# Patient Record
Sex: Female | Born: 1965 | Race: White | Hispanic: No | Marital: Single | Smoking: Current every day smoker
Health system: Southern US, Community
[De-identification: ages and names within clinical notes are randomized; demographics above are authoritative.]

## PROBLEM LIST (undated history)

## (undated) DIAGNOSIS — T8859XA Other complications of anesthesia, initial encounter: Secondary | ICD-10-CM

## (undated) DIAGNOSIS — Z789 Other specified health status: Secondary | ICD-10-CM

## (undated) HISTORY — PX: KNEE ARTHROSCOPY: SUR90

## (undated) HISTORY — PX: KNEE SURGERY: SHX244

## (undated) HISTORY — PX: DENTAL SURGERY: SHX609

---

## 1998-04-21 ENCOUNTER — Emergency Department (HOSPITAL_COMMUNITY): Admission: EM | Admit: 1998-04-21 | Discharge: 1998-04-21 | Payer: Self-pay

## 1998-04-21 ENCOUNTER — Encounter: Payer: Self-pay | Admitting: Endocrinology

## 1999-11-11 ENCOUNTER — Other Ambulatory Visit: Admission: RE | Admit: 1999-11-11 | Discharge: 1999-11-11 | Payer: Self-pay | Admitting: Obstetrics and Gynecology

## 1999-11-14 ENCOUNTER — Encounter: Payer: Self-pay | Admitting: Obstetrics and Gynecology

## 1999-11-14 ENCOUNTER — Inpatient Hospital Stay (HOSPITAL_COMMUNITY): Admission: RE | Admit: 1999-11-14 | Discharge: 1999-11-14 | Payer: Self-pay | Admitting: *Deleted

## 2000-01-08 ENCOUNTER — Ambulatory Visit (HOSPITAL_COMMUNITY): Admission: RE | Admit: 2000-01-08 | Discharge: 2000-01-08 | Payer: Self-pay | Admitting: Obstetrics and Gynecology

## 2000-01-16 ENCOUNTER — Ambulatory Visit (HOSPITAL_COMMUNITY): Admission: RE | Admit: 2000-01-16 | Discharge: 2000-01-16 | Payer: Self-pay | Admitting: Obstetrics and Gynecology

## 2000-03-27 ENCOUNTER — Inpatient Hospital Stay (HOSPITAL_COMMUNITY): Admission: AD | Admit: 2000-03-27 | Discharge: 2000-03-30 | Payer: Self-pay | Admitting: Obstetrics and Gynecology

## 2002-06-20 ENCOUNTER — Inpatient Hospital Stay (HOSPITAL_COMMUNITY): Admission: EM | Admit: 2002-06-20 | Discharge: 2002-06-21 | Payer: Self-pay | Admitting: Emergency Medicine

## 2002-06-27 ENCOUNTER — Encounter: Admission: RE | Admit: 2002-06-27 | Discharge: 2002-06-27 | Payer: Self-pay | Admitting: Family Medicine

## 2002-10-06 ENCOUNTER — Emergency Department (HOSPITAL_COMMUNITY): Admission: EM | Admit: 2002-10-06 | Discharge: 2002-10-06 | Payer: Self-pay | Admitting: Emergency Medicine

## 2002-10-06 ENCOUNTER — Encounter: Payer: Self-pay | Admitting: Emergency Medicine

## 2004-04-21 ENCOUNTER — Encounter: Admission: RE | Admit: 2004-04-21 | Discharge: 2004-04-21 | Payer: Self-pay | Admitting: Family Medicine

## 2004-05-20 ENCOUNTER — Encounter: Admission: RE | Admit: 2004-05-20 | Discharge: 2004-06-02 | Payer: Self-pay | Admitting: Orthopedic Surgery

## 2006-07-19 ENCOUNTER — Emergency Department (HOSPITAL_COMMUNITY): Admission: EM | Admit: 2006-07-19 | Discharge: 2006-07-20 | Payer: Self-pay | Admitting: Emergency Medicine

## 2011-04-17 ENCOUNTER — Emergency Department (HOSPITAL_COMMUNITY): Payer: No Typology Code available for payment source

## 2011-04-17 ENCOUNTER — Emergency Department (HOSPITAL_COMMUNITY)
Admission: EM | Admit: 2011-04-17 | Discharge: 2011-04-18 | Disposition: A | Discharge status: Home / Self Care | Payer: No Typology Code available for payment source | Attending: Emergency Medicine | Admitting: Emergency Medicine

## 2011-04-17 ENCOUNTER — Encounter: Payer: Self-pay | Admitting: Emergency Medicine

## 2011-04-17 DIAGNOSIS — M5380 Other specified dorsopathies, site unspecified: Secondary | ICD-10-CM | POA: Insufficient documentation

## 2011-04-17 DIAGNOSIS — T1490XA Injury, unspecified, initial encounter: Secondary | ICD-10-CM | POA: Insufficient documentation

## 2011-04-17 DIAGNOSIS — M542 Cervicalgia: Secondary | ICD-10-CM | POA: Insufficient documentation

## 2011-04-17 DIAGNOSIS — M549 Dorsalgia, unspecified: Secondary | ICD-10-CM | POA: Insufficient documentation

## 2011-04-17 MED ORDER — CYCLOBENZAPRINE HCL 10 MG PO TABS
10.0000 mg | ORAL_TABLET | Freq: Once | ORAL | Status: AC
  Administered 2011-04-17: 10 mg via ORAL
  Filled 2011-04-17: qty 1

## 2011-04-17 MED ORDER — MORPHINE SULFATE 4 MG/ML IJ SOLN: 4.0000 mg | Freq: Once | INTRAMUSCULAR | Status: DC

## 2011-04-17 MED ORDER — MORPHINE SULFATE 2 MG/ML IJ SOLN
2.0000 mg | Freq: Once | INTRAMUSCULAR | Status: AC
  Administered 2011-04-17: 2 mg via INTRAMUSCULAR
  Filled 2011-04-17: qty 1

## 2011-04-17 MED ORDER — MORPHINE SULFATE 2 MG/ML IJ SOLN
2.0000 mg | Freq: Once | INTRAMUSCULAR | Status: DC
  Filled 2011-04-17: qty 1

## 2011-04-17 MED ORDER — HYDROCODONE-ACETAMINOPHEN 5-325 MG PO TABS: 2.0000 | ORAL_TABLET | Freq: Four times a day (QID) | ORAL | Status: AC | PRN

## 2011-04-17 MED ORDER — ONDANSETRON HCL 4 MG/2ML IJ SOLN
4.0000 mg | Freq: Once | INTRAMUSCULAR | Status: AC
  Administered 2011-04-17: 4 mg via INTRAMUSCULAR
  Filled 2011-04-17: qty 2

## 2011-04-17 MED ORDER — MORPHINE SULFATE 2 MG/ML IJ SOLN
2.0000 mg | Freq: Once | INTRAMUSCULAR | Status: AC
  Administered 2011-04-17: 2 mg via INTRAMUSCULAR

## 2011-04-17 MED ORDER — CYCLOBENZAPRINE HCL 10 MG PO TABS: 10.0000 mg | ORAL_TABLET | Freq: Three times a day (TID) | ORAL | Status: AC | PRN
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
## 2011-04-17 NOTE — ED Notes (Signed)
Returned from xray

## 2011-04-17 NOTE — ED Notes (Signed)
Patient transported to X-ray 

## 2011-05-25 ENCOUNTER — Ambulatory Visit: Payer: No Typology Code available for payment source

## 2011-06-01 ENCOUNTER — Ambulatory Visit: Payer: No Typology Code available for payment source | Attending: Orthopedic Surgery

## 2011-06-09 ENCOUNTER — Other Ambulatory Visit: Payer: Self-pay | Admitting: Orthopedic Surgery

## 2011-06-09 DIAGNOSIS — M545 Low back pain: Secondary | ICD-10-CM

## 2011-06-24 ENCOUNTER — Ambulatory Visit: Payer: No Typology Code available for payment source | Attending: Orthopedic Surgery

## 2011-06-24 DIAGNOSIS — M2560 Stiffness of unspecified joint, not elsewhere classified: Secondary | ICD-10-CM | POA: Insufficient documentation

## 2011-06-24 DIAGNOSIS — IMO0001 Reserved for inherently not codable concepts without codable children: Secondary | ICD-10-CM | POA: Insufficient documentation

## 2011-06-24 DIAGNOSIS — M2550 Pain in unspecified joint: Secondary | ICD-10-CM | POA: Insufficient documentation

## 2011-06-29 ENCOUNTER — Ambulatory Visit: Payer: No Typology Code available for payment source | Admitting: Rehabilitation

## 2011-07-01 ENCOUNTER — Ambulatory Visit: Payer: No Typology Code available for payment source | Admitting: Physical Therapy

## 2011-07-06 ENCOUNTER — Ambulatory Visit: Payer: No Typology Code available for payment source | Attending: Orthopedic Surgery | Admitting: Rehabilitation

## 2011-07-06 DIAGNOSIS — IMO0001 Reserved for inherently not codable concepts without codable children: Secondary | ICD-10-CM | POA: Insufficient documentation

## 2011-07-06 DIAGNOSIS — M2550 Pain in unspecified joint: Secondary | ICD-10-CM | POA: Insufficient documentation

## 2011-07-06 DIAGNOSIS — M2560 Stiffness of unspecified joint, not elsewhere classified: Secondary | ICD-10-CM | POA: Insufficient documentation

## 2011-07-08 ENCOUNTER — Inpatient Hospital Stay: Admission: RE | Admit: 2011-07-08 | Payer: No Typology Code available for payment source | Source: Ambulatory Visit

## 2011-07-09 ENCOUNTER — Ambulatory Visit: Payer: No Typology Code available for payment source | Admitting: Rehabilitation

## 2011-07-14 ENCOUNTER — Ambulatory Visit: Payer: No Typology Code available for payment source | Admitting: Rehabilitation

## 2011-07-16 ENCOUNTER — Ambulatory Visit: Payer: No Typology Code available for payment source | Admitting: Rehabilitation

## 2011-07-27 ENCOUNTER — Ambulatory Visit: Payer: No Typology Code available for payment source | Admitting: Rehabilitation

## 2011-07-30 ENCOUNTER — Encounter: Payer: No Typology Code available for payment source | Admitting: Rehabilitation

## 2011-08-14 ENCOUNTER — Ambulatory Visit: Payer: No Typology Code available for payment source | Attending: Orthopedic Surgery | Admitting: Rehabilitation

## 2011-08-14 DIAGNOSIS — IMO0001 Reserved for inherently not codable concepts without codable children: Secondary | ICD-10-CM | POA: Insufficient documentation

## 2011-08-14 DIAGNOSIS — M2550 Pain in unspecified joint: Secondary | ICD-10-CM | POA: Insufficient documentation

## 2011-08-14 DIAGNOSIS — M2560 Stiffness of unspecified joint, not elsewhere classified: Secondary | ICD-10-CM | POA: Insufficient documentation

## 2013-07-29 ENCOUNTER — Emergency Department (HOSPITAL_COMMUNITY): Payer: PRIVATE HEALTH INSURANCE

## 2013-07-29 ENCOUNTER — Emergency Department (HOSPITAL_COMMUNITY)
Admission: EM | Admit: 2013-07-29 | Discharge: 2013-07-29 | Disposition: A | Discharge status: Home / Self Care | Payer: Self-pay | Attending: Emergency Medicine | Admitting: Emergency Medicine

## 2013-07-29 ENCOUNTER — Emergency Department (HOSPITAL_COMMUNITY): Payer: Self-pay

## 2013-07-29 ENCOUNTER — Encounter (HOSPITAL_COMMUNITY): Payer: Self-pay | Admitting: Emergency Medicine

## 2013-07-29 DIAGNOSIS — W08XXXA Fall from other furniture, initial encounter: Secondary | ICD-10-CM | POA: Insufficient documentation

## 2013-07-29 DIAGNOSIS — S6990XA Unspecified injury of unspecified wrist, hand and finger(s), initial encounter: Principal | ICD-10-CM | POA: Insufficient documentation

## 2013-07-29 DIAGNOSIS — S6991XA Unspecified injury of right wrist, hand and finger(s), initial encounter: Secondary | ICD-10-CM

## 2013-07-29 DIAGNOSIS — S59919A Unspecified injury of unspecified forearm, initial encounter: Principal | ICD-10-CM

## 2013-07-29 DIAGNOSIS — S59909A Unspecified injury of unspecified elbow, initial encounter: Secondary | ICD-10-CM | POA: Insufficient documentation

## 2013-07-29 DIAGNOSIS — Y9389 Activity, other specified: Secondary | ICD-10-CM | POA: Insufficient documentation

## 2013-07-29 DIAGNOSIS — Y9289 Other specified places as the place of occurrence of the external cause: Secondary | ICD-10-CM | POA: Insufficient documentation

## 2013-07-29 MED ORDER — MORPHINE SULFATE 4 MG/ML IJ SOLN
4.0000 mg | Freq: Once | INTRAMUSCULAR | Status: AC
  Administered 2013-07-29: 4 mg via INTRAMUSCULAR
  Filled 2013-07-29: qty 1

## 2013-07-29 MED ORDER — OXYCODONE-ACETAMINOPHEN 5-325 MG PO TABS: 1.0000 | ORAL_TABLET | ORAL | Status: DC | PRN

## 2013-07-29 NOTE — ED Provider Notes (Signed)
Medical screening examination/treatment/procedure(s) were performed by non-physician practitioner and as supervising physician I was immediately available for consultation/collaboration.   Nelia Shiobert L Katrinna Travieso, MD 07/29/13 2113

## 2013-07-29 NOTE — Discharge Instructions (Signed)
1. Medications: percocet, usual home medications 2. Treatment: rest, drink plenty of fluids,  3. Follow Up: Please followup with Dr. Mina MarbleWeingold as directed on Tuesday   Wrist Pain Wrist injuries are frequent in adults and children. A sprain is an injury to the ligaments that hold your bones together. A strain is an injury to muscle or muscle cord-like structures (tendons) from stretching or pulling. Generally, when wrists are moderately tender to touch following a fall or injury, a break in the bone (fracture) may be present. Most wrist sprains or strains are better in 3 to 5 days, but complete healing may take several weeks. HOME CARE INSTRUCTIONS   Put ice on the injured area.  Put ice in a plastic bag.  Place a towel between your skin and the bag.  Leave the ice on for 15-20 minutes, 03-04 times a day, for the first 2 days.  Keep your arm raised above the level of your heart whenever possible to reduce swelling and pain.  Rest the injured area for at least 48 hours or as directed by your caregiver.  If a splint or elastic bandage has been applied, use it for as long as directed by your caregiver or until seen by a caregiver for a follow-up exam.  Only take over-the-counter or prescription medicines for pain, discomfort, or fever as directed by your caregiver.  Keep all follow-up appointments. You may need to follow up with a specialist or have follow-up X-rays. Improvement in pain level is not a guarantee that you did not fracture a bone in your wrist. The only way to determine whether or not you have a broken bone is by X-ray. SEEK IMMEDIATE MEDICAL CARE IF:   Your fingers are swollen, very red, white, or cold and blue.  Your fingers are numb or tingling.  You have increasing pain.  You have difficulty moving your fingers. MAKE SURE YOU:   Understand these instructions.  Will watch your condition.  Will get help right away if you are not doing well or get worse. Document  Released: 01/28/2005 Document Revised: 07/13/2011 Document Reviewed: 06/11/2010 [facility] Patient Information 2014 [employer], [place].

## 2013-07-29 NOTE — Progress Notes (Signed)
Orthopedic Tech Progress Note Patient Details:  Diana ArdsKristie Aydelotte December 23, 1965 [id number]  Ortho Devices Type of Ortho Device: Ace wrap;Arm sling;Sugartong splint Ortho Device/Splint Location: rue Ortho Device/Splint Interventions: Application   Loralye Loberg 07/29/2013, 9:08 PM
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
## 2013-07-29 NOTE — ED Notes (Signed)
Patient transported to X-ray 

## 2013-07-29 NOTE — ED Provider Notes (Signed)
CSN: [id number]     Arrival date & time 07/29/13  1701 History   First MD Initiated Contact with Patient 07/29/13 1716     Chief Complaint  Patient presents with  . Wrist Injury     (Consider location/radiation/quality/duration/timing/severity/associated sxs/prior Treatment) Patient is a 48 y.o. female presenting with wrist injury. The history is provided by the patient and medical records. No language interpreter was used.  Wrist Injury Associated symptoms: no back pain, no fever and no neck pain     Diana ArdsKristie Neuser is a 48 y.o. female  with no major medical Hx presents to the Emergency Department complaining of acute, persistent right wrist pain onset 1 hour PTA after falling from a small stool while painting. She reports she laded on her wrist in the flexed position.  She denies hitting her head, LOC< neck or back pain.  She reports no pain in the elbow or fingers.  Pt did not take anything PTA for pain.  . Holding very still makes it better and palpation and movement makes it worse.  Pt denies numbness, tingling.     History reviewed. No pertinent past medical history. Past Surgical History  Procedure Laterality Date  . Knee surgery     History reviewed. No pertinent family history. History  Substance Use Topics  . Smoking status: Not on file  . Smokeless tobacco: Not on file  . Alcohol Use:    OB History   Grav Para Term Preterm Abortions TAB SAB Ect Mult Living                 Review of Systems  Constitutional: Negative for fever and chills.  HENT: Negative for facial swelling.   Cardiovascular: Negative for chest pain.  Gastrointestinal: Negative for nausea, vomiting and abdominal pain.  Musculoskeletal: Positive for arthralgias and joint swelling. Negative for back pain, neck pain and neck stiffness.  Skin: Positive for color change. Negative for wound.  Allergic/Immunologic: Negative for immunocompromised state.  Neurological: Negative for numbness.  Hematological:  Does not bruise/bleed easily.  Psychiatric/Behavioral: The patient is not nervous/anxious.   All other systems reviewed and are negative.      Allergies  Hydrocodone  Home Medications   Current Outpatient Rx  Name  Route  Sig  Dispense  Refill  . oxyCODONE-acetaminophen (PERCOCET/ROXICET) 5-325 MG per tablet   Oral   Take 1-2 tablets by mouth every 4 (four) hours as needed for severe pain.   25 tablet   0    BP 114/79  Pulse 99  Temp(Src) 97.6 F (36.4 C) (Oral)  Resp 18  SpO2 97%  LMP 06/11/2013 Physical Exam  Nursing note and vitals reviewed. Constitutional: She is oriented to person, place, and time. She appears well-developed and well-nourished. No distress.  HENT:  Head: Normocephalic and atraumatic.  Mouth/Throat: Oropharynx is clear and moist. No oropharyngeal exudate.  Eyes: Conjunctivae are normal.  Neck: Normal range of motion and full passive range of motion without pain. Neck supple. No spinous process tenderness and no muscular tenderness present. No rigidity. Normal range of motion present.  Full ROM without pain No midline or paraspinal tenderness  Cardiovascular: Normal rate, regular rhythm, normal heart sounds and intact distal pulses.   No murmur heard. Capillary refill < 3 seconds  Pulmonary/Chest: Effort normal and breath sounds normal. No respiratory distress. She has no wheezes.  No crepitus, flail segment or contusion Clear and equal breath sounds  Abdominal: Soft. She exhibits no distension. There is no tenderness.  Musculoskeletal: She exhibits tenderness. She exhibits no edema.       Right shoulder: Normal.       Right elbow: She exhibits normal range of motion, no swelling, no effusion, no deformity and no laceration. No tenderness found.       Right wrist: She exhibits decreased range of motion, tenderness, bony tenderness, swelling and deformity. She exhibits no laceration.       Right hand: She exhibits normal range of motion, no  tenderness, no bony tenderness, normal two-point discrimination, normal capillary refill, no deformity, no laceration and no swelling. Normal sensation noted. Decreased strength noted. She exhibits wrist extension trouble (2/2 pain). She exhibits no finger abduction and no thumb/finger opposition.  Full range of motion of the T-spine and L-spine No tenderness to palpation of the spinous processes of the T-spine or L-spine No tenderness to palpation of the paraspinous muscles of the L-spine  Lymphadenopathy:    She has no cervical adenopathy.  Neurological: She is alert and oriented to person, place, and time. She has normal reflexes. Coordination normal.  Speech is clear and goal oriented, follows commands Normal strength in upper and lower extremities bilaterally including dorsiflexion and plantar flexion, strong and equal grip strength Sensation normal to light and sharp touch in all extremities, but specifically the right hand Moves extremities without ataxia, coordination intact Normal gait Normal balance   Skin: Skin is warm and dry. No rash noted. She is not diaphoretic. No erythema.  No tenting of the skin  Psychiatric: She has a normal mood and affect. Her behavior is normal.    ED Course  Procedures (including critical care time) Labs Review Labs Reviewed - No data to display Imaging Review Dg Wrist Complete Right  07/29/2013   CLINICAL DATA:  Fall from a ladder.  Wrist bruising.  EXAM: RIGHT WRIST - COMPLETE 3+ VIEW  COMPARISON:  Report from hand radiographs from 04/21/1998  FINDINGS: The wrist is hyperextended during imaging, causing superimposition of the bases of the metacarpals and the distal carpal row which reduces diagnostic sensitivity and specificity. On the dedicated scaphoid view attempt, the scaphoid is obscured by the distal carpal row and the base of the second metacarpal.  I do not see a definite fracture.  IMPRESSION: 1. The wrist is prominently hyperextended during  imaging. This significantly reduces negative predictive value, and raises my concern that the patient is not able to straighten her wrist which may be an indicator of occult injury. Consider CT or MRI. 2. Mild soft tissue swelling overlying the radius laterally.   Electronically Signed   By: Herbie Baltimore M.D.   On: 07/29/2013 18:42   Ct Wrist Right Wo Contrast  07/29/2013   CLINICAL DATA:  Fall. Right wrist pain, swelling. Decreased range of motion.  EXAM: CT OF THE RIGHT WRIST WITHOUT CONTRAST  TECHNIQUE: Multidetector CT imaging was performed according to the standard protocol. Multiplanar CT image reconstructions were also generated.  COMPARISON:  DG WRIST COMPLETE*R* dated 07/29/2013  FINDINGS: There is marked posterior soft tissue swelling in the wrist and hand. There is also fluid noted anteriorly in the carpal tunnel and extending into the thenar eminence. There appears to be subluxation of the distal radial ulnar joint. Slight widening of this joint space anteriorly. No fracture is noted.  IMPRESSION: Evidence for soft tissue injury with marked posterior soft tissue swelling and fluid in the carpal tunnel. Subluxed distal radial ulnar joint. I would recommend further evaluation with elective MRI to assess for  ligamentous injury. No fracture seen.   Electronically Signed   By: Charlett Nose M.D.   On: 07/29/2013 20:05     EKG Interpretation None      MDM   Final diagnoses:  Right wrist injury   Diana Nolan presents with injury to the right wrist after fall.  No concern for head, neck or back injury based on HX and PE.  X-ray pending.  Will give pain control.    Xray with  The wrist is prominently hyperextended during imaging. "This significantly reduces negative predictive value, and raises my concern that the patient is not able to straighten her wrist which may be an indicator of occult injury."  Will obtain CT scan.  Discussed with Nelva Nay, MD who recommends hand consult.     8:48 PM Weingold consulted who recommends sugar tong splint and follow-up on Tuesday in his office.  Pt able to flex and extend a total of 45 degrees respectively now that pain is adequately controlled, which was impossible before.    Sugar tong splint placed with adequate capillary refill and sensation intact both pre and post splint placement.    It has been determined that no acute conditions requiring further emergency intervention are present at this time. The patient/guardian have been advised of the diagnosis and plan. We have discussed signs and symptoms that warrant return to the ED, such as changes or worsening in symptoms.   Vital signs are stable at discharge.   BP 114/79  Pulse 99  Temp(Src) 97.6 F (36.4 C) (Oral)  Resp 18  SpO2 97%  LMP 06/11/2013  Patient/guardian has voiced understanding and agreed to follow-up with the PCP or specialist.        Dierdre Forth, PA-C 07/29/13 2110

## 2015-07-08 ENCOUNTER — Encounter (HOSPITAL_COMMUNITY): Payer: Self-pay | Admitting: Emergency Medicine

## 2015-07-08 ENCOUNTER — Emergency Department (HOSPITAL_COMMUNITY)
Admission: EM | Admit: 2015-07-08 | Discharge: 2015-07-08 | Disposition: A | Discharge status: Home / Self Care | Payer: Medicaid Other | Attending: Physician Assistant | Admitting: Physician Assistant

## 2015-07-08 DIAGNOSIS — H04302 Unspecified dacryocystitis of left lacrimal passage: Secondary | ICD-10-CM | POA: Diagnosis not present

## 2015-07-08 DIAGNOSIS — F17210 Nicotine dependence, cigarettes, uncomplicated: Secondary | ICD-10-CM | POA: Diagnosis not present

## 2015-07-08 DIAGNOSIS — S0502XA Injury of conjunctiva and corneal abrasion without foreign body, left eye, initial encounter: Secondary | ICD-10-CM | POA: Diagnosis not present

## 2015-07-08 DIAGNOSIS — Y9289 Other specified places as the place of occurrence of the external cause: Secondary | ICD-10-CM | POA: Diagnosis not present

## 2015-07-08 DIAGNOSIS — Y9389 Activity, other specified: Secondary | ICD-10-CM | POA: Insufficient documentation

## 2015-07-08 DIAGNOSIS — X58XXXA Exposure to other specified factors, initial encounter: Secondary | ICD-10-CM | POA: Diagnosis not present

## 2015-07-08 DIAGNOSIS — S0592XA Unspecified injury of left eye and orbit, initial encounter: Secondary | ICD-10-CM | POA: Diagnosis present

## 2015-07-08 DIAGNOSIS — H109 Unspecified conjunctivitis: Secondary | ICD-10-CM | POA: Diagnosis not present

## 2015-07-08 DIAGNOSIS — Y998 Other external cause status: Secondary | ICD-10-CM | POA: Insufficient documentation

## 2015-07-08 MED ORDER — HYDROCODONE-ACETAMINOPHEN 5-325 MG PO TABS
1.0000 | ORAL_TABLET | Freq: Once | ORAL | Status: AC
  Administered 2015-07-08: 1 via ORAL
  Filled 2015-07-08: qty 1

## 2015-07-08 MED ORDER — TETRACAINE HCL 0.5 % OP SOLN
1.0000 [drp] | Freq: Once | OPHTHALMIC | Status: AC
  Administered 2015-07-08: 2 [drp] via OPHTHALMIC
  Filled 2015-07-08: qty 2

## 2015-07-08 MED ORDER — CLINDAMYCIN HCL 300 MG PO CAPS: 300.0000 mg | ORAL_CAPSULE | Freq: Four times a day (QID) | ORAL | Status: DC

## 2015-07-08 MED ORDER — POLYMYXIN B-TRIMETHOPRIM 10000-0.1 UNIT/ML-% OP SOLN: 1.0000 [drp] | OPHTHALMIC | Status: DC

## 2015-07-08 MED ORDER — FLUORESCEIN SODIUM 1 MG OP STRP
1.0000 | ORAL_STRIP | Freq: Once | OPHTHALMIC | Status: AC
  Administered 2015-07-08: 1 via OPHTHALMIC
  Filled 2015-07-08: qty 1

## 2015-07-08 NOTE — Discharge Instructions (Signed)
You have a corneal abrasion of your left eye as well as possible infection as well. I will give you prescriptions for antibiotic drops as well as pills. Please take as directed. Please see an ophthalmologist TOMORROW for follow up evaluation. Return to the ER for new or worsening symptoms.

## 2015-07-08 NOTE — ED Provider Notes (Signed)
CSN: [id number]     Arrival date & time 07/08/15  [id number] History  By signing my name below, I, Diana Nolan, attest that this documentation has been prepared under the direction and in the presence of non-physician practitioner, Noelle PennerSerena Gracelynne Benedict, PA-C. Electronically Signed: Linna Darnerussell Nolan, Scribe. 07/08/2015. 7:18 PM.    Chief Complaint  Patient presents with  . Eye Injury    The history is provided by the patient. No language interpreter was used.     HPI Comments: Diana Nolan is a 50 y.o. female with no pertinent PMHx who presents to the Emergency Department complaining of left eye injury and constant, severe left eye pain onset 4 days ago. Pt notes that she was trying to remove glass from the inside of her fingernail and accidentally rubbed the inside corner of her left eye with the finger containing glass; she thinks that she may have glass inside of her left eye. Pt notes a severe burning sensation in her left eye and endorses significant pain exacerbation with left eye movement. Pt endorses left eye swelling and states that she squeezed pus out of the outside edge of her left eye earlier today. She has experienced tearing, redness, blurry vision due to tears as well. Pt has taken Advil for her left eye pain with no relief. She notes that her right eye feels normal. Pt denies numbness or any other associated symptoms.    History reviewed. No pertinent past medical history. Past Surgical History  Procedure Laterality Date  . Knee surgery     History reviewed. No pertinent family history. Social History  Substance Use Topics  . Smoking status: Current Some Day Smoker -- 1.00 packs/day    Types: Cigarettes  . Smokeless tobacco: None  . Alcohol Use: No   OB History    No data available     Review of Systems  Eyes: Positive for pain, redness and visual disturbance (blurry vision, double vision).  Neurological: Negative for numbness.  All other systems reviewed and are  negative.     Allergies  Hydrocodone  Home Medications   Prior to Admission medications   Medication Sig Start Date End Date Taking? Authorizing Provider  oxyCODONE-acetaminophen (PERCOCET/ROXICET) 5-325 MG per tablet Take 1-2 tablets by mouth every 4 (four) hours as needed for severe pain. 07/29/13   Hannah Muthersbaugh, PA-C   BP 108/65 mmHg  Pulse 88  Temp(Src) 98.2 F (36.8 C) (Oral)  Resp 20  Ht 5' 2.5" (1.588 m)  Wt 140 lb (63.504 kg)  BMI 25.18 kg/m2  SpO2 96%  LMP 11/06/2014 Physical Exam  Constitutional: She is oriented to person, place, and time. She appears well-developed and well-nourished. No distress.  HENT:  Head: Normocephalic and atraumatic.  Eyes: EOM are normal. Pupils are equal, round, and reactive to light.    Left eye with marked conjunctival injection. Lid and periorbital area diffusely tender though marked tenderness at inner corner. There is some purulent drainage expressed when palpating inner corner. EOM intact though painful. No periorbital edema, mild erythema.   On wood's lamp exam there is a 3mm of fluorescein uptake at 5:00 position. No other abrasion visible.  There is some crusting/matting of upper eyelashes   Neck: Neck supple. No tracheal deviation present.  Cardiovascular: Normal rate.   Pulmonary/Chest: Effort normal. No respiratory distress.  Musculoskeletal: Normal range of motion.  Neurological: She is alert and oriented to person, place, and time.  Skin: Skin is warm and dry.  Psychiatric: She has a normal  mood and affect. Her behavior is normal.  Nursing note and vitals reviewed.    Visual Acuity  Right Eye Distance: 20/20 Left Eye Distance: 20/32 Bilateral Distance: 20/20  Right Eye Near:   Left Eye Near:    Bilateral Near:      ED Course  Procedures (including critical care time)  DIAGNOSTIC STUDIES: Oxygen Saturation is 96% on RA, adeuqate by my interpretation.    COORDINATION OF CARE: 7:18 PM Will administer  hydrocodone 1 tablet, 1 fluorescein ophthalmic strip, and eyedrops. Discussed treatment plan with pt at bedside and pt agreed to plan.   Labs Review Labs Reviewed - No data to display  Imaging Review No results found. I have personally reviewed and evaluated these images and lab results as part of my medical decision-making.   EKG Interpretation None      MDM   Final diagnoses:  Corneal abrasion, left, initial encounter  Conjunctivitis of left eye  Dacrocystitis, left    Pt with area of fluorescein uptake at 5:00 position of left eye consistent with corneal abrasion. Given purulent discharge and tenderness at inner corner will cover for dacrocystitis as well. Rx given for polytrim B drops as well as clindamycin. Strict instructions to see opthalmology within 24 hours. ER return precautions given.   I personally performed the services described in this documentation, which was scribed in my presence. The recorded information has been reviewed and is accurate.     Carlene Coria, PA-C 07/08/15 2007  Courteney Randall An, MD 07/09/15 1626

## 2015-07-08 NOTE — ED Notes (Signed)
Pt states she had a pice of glass on inside her finger nail she was trying to get it out and accidentally she rub her left eye and she is been feeling pain and burning sensation on that eye for 3 days and she feels like she has the glass inside her eye. Eye looks red and watery now.

## 2015-09-09 ENCOUNTER — Encounter (HOSPITAL_COMMUNITY): Payer: Self-pay | Admitting: *Deleted

## 2015-09-09 ENCOUNTER — Emergency Department (HOSPITAL_COMMUNITY)
Admission: EM | Admit: 2015-09-09 | Discharge: 2015-09-09 | Disposition: A | Discharge status: Home / Self Care | Payer: Medicaid Other | Attending: Emergency Medicine | Admitting: Emergency Medicine

## 2015-09-09 DIAGNOSIS — W57XXXA Bitten or stung by nonvenomous insect and other nonvenomous arthropods, initial encounter: Secondary | ICD-10-CM | POA: Diagnosis not present

## 2015-09-09 DIAGNOSIS — S60463A Insect bite (nonvenomous) of left middle finger, initial encounter: Secondary | ICD-10-CM | POA: Insufficient documentation

## 2015-09-09 DIAGNOSIS — F17210 Nicotine dependence, cigarettes, uncomplicated: Secondary | ICD-10-CM | POA: Diagnosis not present

## 2015-09-09 DIAGNOSIS — Y9289 Other specified places as the place of occurrence of the external cause: Secondary | ICD-10-CM | POA: Insufficient documentation

## 2015-09-09 DIAGNOSIS — Y9389 Activity, other specified: Secondary | ICD-10-CM | POA: Insufficient documentation

## 2015-09-09 DIAGNOSIS — Y998 Other external cause status: Secondary | ICD-10-CM | POA: Insufficient documentation

## 2015-09-09 DIAGNOSIS — L0390 Cellulitis, unspecified: Secondary | ICD-10-CM | POA: Diagnosis not present

## 2015-09-09 NOTE — ED Notes (Signed)
Called pt's name to obtain vitals, pt is not in the waiting room.

## 2015-09-09 NOTE — ED Notes (Signed)
Pt reports possible spider bite to left middle finger since Thursday. Swelling and redness noted to finger, reports last night having swelling to entire hand and red streaks up her arm.

## 2015-09-09 NOTE — ED Notes (Signed)
Pt's name called to recheck vitals no answer 

## 2016-12-23 ENCOUNTER — Ambulatory Visit (HOSPITAL_COMMUNITY)
Admission: EM | Admit: 2016-12-23 | Discharge: 2016-12-23 | Disposition: A | Discharge status: Home / Self Care | Payer: PRIVATE HEALTH INSURANCE | Attending: Emergency Medicine | Admitting: Emergency Medicine

## 2016-12-23 ENCOUNTER — Encounter (HOSPITAL_COMMUNITY): Payer: Self-pay | Admitting: Emergency Medicine

## 2016-12-23 DIAGNOSIS — R21 Rash and other nonspecific skin eruption: Secondary | ICD-10-CM | POA: Diagnosis not present

## 2016-12-23 DIAGNOSIS — L237 Allergic contact dermatitis due to plants, except food: Secondary | ICD-10-CM | POA: Diagnosis not present

## 2016-12-23 MED ORDER — PREDNISONE 20 MG PO TABS: ORAL_TABLET | ORAL | 0 refills | Status: DC

## 2016-12-23 MED ORDER — TRIAMCINOLONE ACETONIDE 0.1 % EX CREA: 1.0000 "application " | TOPICAL_CREAM | Freq: Two times a day (BID) | CUTANEOUS | 0 refills | Status: DC

## 2016-12-23 MED ORDER — TRIAMCINOLONE ACETONIDE 40 MG/ML IJ SUSP
40.0000 mg | Freq: Once | INTRAMUSCULAR | Status: AC
  Administered 2016-12-23: 40 mg via INTRAMUSCULAR

## 2016-12-23 MED ORDER — TRIAMCINOLONE ACETONIDE 40 MG/ML IJ SUSP
INTRAMUSCULAR | Status: AC
  Filled 2016-12-23: qty 1

## 2016-12-23 NOTE — ED Provider Notes (Signed)
[facility]    CSN: [id number] Arrival date & time: 12/23/16  3762     History   Chief Complaint Chief Complaint  Patient presents with  . Poison Ivy    HPI Diana Nolan is a 51 y.o. female.   51 year old female was performing yard work but a week ago and she believes she was exposed to some sort of poisonous plant. This was followed by a pruritic rash involving the extremities, face and lesser to the torso. She has been itching all week. Denies intraoral lesions or swelling no problems with breathing or wheezing no peripheral edema.      History reviewed. No pertinent past medical history.  There are no active problems to display for this patient.   Past Surgical History:  Procedure Laterality Date  . KNEE SURGERY      OB History    No data available       Home Medications    Prior to Admission medications   Medication Sig Start Date End Date Taking? Authorizing Provider  predniSONE (DELTASONE) 20 MG tablet 3 Tabs PO Days 1-3, then 2 tabs PO Days 4-6, then 1 tab PO Day 7-9, then Half Tab PO Day 10-12 12/23/16   Hayden Rasmussen, NP  triamcinolone cream (KENALOG) 0.1 % Apply 1 application topically 2 (two) times daily. 12/23/16   Hayden Rasmussen, NP    Family History History reviewed. No pertinent family history.  Social History Social History  Substance Use Topics  . Smoking status: Current Some Day Smoker    Packs/day: 1.00    Types: Cigarettes  . Smokeless tobacco: Never Used  . Alcohol use No     Allergies   Hydrocodone   Review of Systems Review of Systems  Constitutional: Negative.   HENT: Negative.   Respiratory: Negative.  Negative for cough and shortness of breath.   Neurological: Negative.   All other systems reviewed and are negative.    Physical Exam Triage Vital Signs ED Triage Vitals [12/23/16 1015]  Enc Vitals Group     BP (!) 145/74     Pulse Rate 76     Resp      Temp 98.3 F (36.8 C)     Temp Source Oral   SpO2 97 %     Weight      Height      Head Circumference      Peak Flow      Pain Score      Pain Loc      Pain Edu?      Excl. in GC?    No data found.   Updated Vital Signs BP (!) 145/74 (BP Location: Right Arm)   Pulse 76   Temp 98.3 F (36.8 C) (Oral)   LMP 11/06/2014   SpO2 97%   Visual Acuity Right Eye Distance:   Left Eye Distance:   Bilateral Distance:    Right Eye Near:   Left Eye Near:    Bilateral Near:     Physical Exam  Constitutional: She is oriented to person, place, and time. She appears well-developed and well-nourished. No distress.  HENT:  Mouth/Throat: Oropharynx is clear and moist.  Eyes: EOM are normal.  Neck: Normal range of motion. Neck supple.  Cardiovascular: Normal rate.   Pulmonary/Chest: Effort normal and breath sounds normal. No respiratory distress. She has no wheezes.  Musculoskeletal: She exhibits no edema.  Neurological: She is alert and oriented to person, place, and time. She exhibits normal  muscle tone.  Skin: Skin is warm and dry. Rash noted.  Primarily papular rash with a few vesicles to the upper and lower extremities, interdigital webspaces and fingers, groin and papules to the face consistent with plant contact dermatitis  Psychiatric: She has a normal mood and affect.  Nursing note and vitals reviewed.    UC Treatments / Results  Labs (all labs ordered are listed, but only abnormal results are displayed) Labs Reviewed - No data to display  EKG  EKG Interpretation None       Radiology No results found.  Procedures Procedures (including critical care time)  Medications Ordered in UC Medications  triamcinolone acetonide (KENALOG-40) injection 40 mg (not administered)     Initial Impression / Assessment and Plan / UC Course  I have reviewed the triage vital signs and the nursing notes.  Pertinent labs & imaging results that were available during my care of the patient were reviewed by me and considered in  my medical decision making (see chart for details).     Apply the triamcinolone cream to the areas that itch the most. He may also apply Benadryl spray or cream every 4 hours. Taken any histamines such a Zyrtec or Allegra. During the evening or bedtime he may take Benadryl to help with itching. He may also apply Caladryl lotion and place of Benadryl.   Final Clinical Impressions(s) / UC Diagnoses   Final diagnoses:  Allergic contact dermatitis due to plants, except food    New Prescriptions New Prescriptions   PREDNISONE (DELTASONE) 20 MG TABLET    3 Tabs PO Days 1-3, then 2 tabs PO Days 4-6, then 1 tab PO Day 7-9, then Half Tab PO Day 10-12   TRIAMCINOLONE CREAM (KENALOG) 0.1 %    Apply 1 application topically 2 (two) times daily.     Controlled Substance Prescriptions  Controlled Substance Registry consulted? Not Applicable   Hayden Rasmussen, NP 12/23/16 1036

## 2016-12-23 NOTE — ED Triage Notes (Signed)
Pt reports doing yard work one week ago and since then she has had a rash that is spreading all over her body.  She believes it is poison ivy.  She has clear liquid oozing from her areas.

## 2016-12-23 NOTE — Discharge Instructions (Signed)
Apply the triamcinolone cream to the areas that itch the most. He may also apply Benadryl spray or cream every 4 hours. Taken any histamines such a Zyrtec or Allegra. During the evening or bedtime he may take Benadryl to help with itching. He may also apply Caladryl lotion and place of Benadryl.

## 2017-01-09 ENCOUNTER — Ambulatory Visit (HOSPITAL_COMMUNITY)
Admission: EM | Admit: 2017-01-09 | Discharge: 2017-01-09 | Disposition: A | Discharge status: Home / Self Care | Payer: Medicaid Other | Attending: Family Medicine | Admitting: Family Medicine

## 2017-01-09 ENCOUNTER — Encounter (HOSPITAL_COMMUNITY): Payer: Self-pay | Admitting: Emergency Medicine

## 2017-01-09 DIAGNOSIS — R21 Rash and other nonspecific skin eruption: Secondary | ICD-10-CM | POA: Diagnosis not present

## 2017-01-09 MED ORDER — MOMETASONE FUROATE 0.1 % EX CREA: 1.0000 "application " | TOPICAL_CREAM | Freq: Every day | CUTANEOUS | 0 refills | Status: AC

## 2017-01-09 MED ORDER — METHYLPREDNISOLONE 4 MG PO TBPK: ORAL_TABLET | ORAL | 0 refills | Status: DC

## 2017-01-09 MED ORDER — PERMETHRIN 5 % EX CREA: TOPICAL_CREAM | CUTANEOUS | 0 refills | Status: DC

## 2017-01-09 NOTE — Discharge Instructions (Signed)
This may be poison ivy like you said or it could be scabies.   For the permethrin cream: This is for the scabies. You need to apply from neck down, leave on overnight for 8-14 hours then wash off in the morning. May reapply in 1 week.   Use benadryl for itchiness!  Wash all bedding and clothing used in the last few weeks in hot water. Items that cannot be washed can be placed in a plastic bag for 72 hours.   Take the steroid medication orally as prescribed. Apply the steroid cream only once daily for 7 days.   If you eye gets worse in 2 days, then you need to see an eye doctor immediately or go to the ER.

## 2017-01-09 NOTE — ED Triage Notes (Signed)
Seen 8/22 for potential poison ivy.  Symptoms have worsened: increase in rash, itching and says she runs a fever at times

## 2017-01-09 NOTE — ED Provider Notes (Signed)
[facility]    CSN: [id number] Arrival date & time: 01/09/17  1734     History   Chief Complaint Chief Complaint  Patient presents with  . Rash    HPI Diana Nolan is a 51 y.o. female.   Presents today for rash on her legs and arms present since 08/22 with now a new rash to her left eye onset yesterday. She was seem on 08/22 for poison ivy and was treated accordingly with prednisone and topical Triamcinolone cream. Patient states that her rash never improved and instead is getting worse. Rash is extremely pruritic and patient have been scratching at the rash. She reports blurry vision to her left eye. Rash is non-tender. She denies eye pain.       History reviewed. No pertinent past medical history.  There are no active problems to display for this patient.   Past Surgical History:  Procedure Laterality Date  . KNEE SURGERY      OB History    No data available       Home Medications    Prior to Admission medications   Medication Sig Start Date End Date Taking? Authorizing Provider  methylPREDNISolone (MEDROL DOSEPAK) 4 MG TBPK tablet Take as directed 01/09/17   Lucia EstelleZheng, Sarkis Rhines, NP  mometasone (ELOCON) 0.1 % cream Apply 1 application topically daily. 01/09/17 01/16/17  Lucia EstelleZheng, Elli Groesbeck, NP  permethrin (ELIMITE) 5 % cream Apply once, may repeat in 1 week 01/09/17   Lucia EstelleZheng, Daily Doe, NP  predniSONE (DELTASONE) 20 MG tablet 3 Tabs PO Days 1-3, then 2 tabs PO Days 4-6, then 1 tab PO Day 7-9, then Half Tab PO Day 10-12 12/23/16   Hayden RasmussenMabe, David, NP  triamcinolone cream (KENALOG) 0.1 % Apply 1 application topically 2 (two) times daily. 12/23/16   Hayden RasmussenMabe, David, NP    Family History No family history on file.  Social History Social History  Substance Use Topics  . Smoking status: Current Some Day Smoker    Packs/day: 1.00    Types: Cigarettes  . Smokeless tobacco: Never Used  . Alcohol use No     Allergies   Hydrocodone   Review of Systems Review of Systems    Constitutional:       See HPI     Physical Exam Triage Vital Signs ED Triage Vitals [01/09/17 1846]  Enc Vitals Group     BP 118/71     Pulse Rate 78     Resp 20     Temp 97.8 F (36.6 C)     Temp Source Oral     SpO2 99 %     Weight      Height      Head Circumference      Peak Flow      Pain Score      Pain Loc      Pain Edu?      Excl. in GC?    No data found.   Updated Vital Signs BP 118/71 (BP Location: Left Arm)   Pulse 78   Temp 97.8 F (36.6 C) (Oral)   Resp 20   LMP 11/06/2014   SpO2 99%   Visual Acuity Right Eye Distance: 20/30 Left Eye Distance: 20/40 Bilateral Distance: 20/25  Right Eye Near:   Left Eye Near:    Bilateral Near:     Physical Exam  Constitutional: She appears well-developed and well-nourished.  Cardiovascular: Normal rate.   Pulmonary/Chest: Effort normal.  Skin:  See picture below. Excoriation noted  Nursing note and vitals reviewed.            UC Treatments / Results  Labs (all labs ordered are listed, but only abnormal results are displayed) Labs Reviewed - No data to display  EKG  EKG Interpretation None       Radiology No results found.  Procedures Procedures (including critical care time)  Medications Ordered in UC Medications - No data to display   Initial Impression / Assessment and Plan / UC Course  I have reviewed the triage vital signs and the nursing notes.  Pertinent labs & imaging results that were available during my care of the patient were reviewed by me and considered in my medical decision making (see chart for details).     Final Clinical Impressions(s) / UC Diagnoses   Final diagnoses:  Rash and nonspecific skin eruption   A lot of excoriation noted on exam. DDx includes possible scabies or poison ivy that was not improving due to extensive scratching. Will treat empirically with permethrin cream along with Elocon cream and medrol dosepak. Advised to use benadryl for  itchiness.   Instruction for usage of permethrin cream provided. Also discussed about environmental control measures.   New Prescriptions New Prescriptions   METHYLPREDNISOLONE (MEDROL DOSEPAK) 4 MG TBPK TABLET    Take as directed   MOMETASONE (ELOCON) 0.1 % CREAM    Apply 1 application topically daily.   PERMETHRIN (ELIMITE) 5 % CREAM    Apply once, may repeat in 1 week     Controlled Substance Prescriptions [place] Controlled Substance Registry consulted? Not Applicable   Lucia Estelle, NP 01/09/17 2005

## 2017-01-27 ENCOUNTER — Encounter (HOSPITAL_COMMUNITY): Payer: Self-pay | Admitting: Emergency Medicine

## 2017-01-27 DIAGNOSIS — S80869A Insect bite (nonvenomous), unspecified lower leg, initial encounter: Secondary | ICD-10-CM | POA: Insufficient documentation

## 2017-01-27 DIAGNOSIS — W57XXXA Bitten or stung by nonvenomous insect and other nonvenomous arthropods, initial encounter: Secondary | ICD-10-CM | POA: Diagnosis not present

## 2017-01-27 DIAGNOSIS — S40869A Insect bite (nonvenomous) of unspecified upper arm, initial encounter: Secondary | ICD-10-CM | POA: Diagnosis not present

## 2017-01-27 DIAGNOSIS — Z79899 Other long term (current) drug therapy: Secondary | ICD-10-CM | POA: Insufficient documentation

## 2017-01-27 DIAGNOSIS — Y999 Unspecified external cause status: Secondary | ICD-10-CM | POA: Diagnosis not present

## 2017-01-27 DIAGNOSIS — F17210 Nicotine dependence, cigarettes, uncomplicated: Secondary | ICD-10-CM | POA: Diagnosis not present

## 2017-01-27 DIAGNOSIS — Y929 Unspecified place or not applicable: Secondary | ICD-10-CM | POA: Diagnosis not present

## 2017-01-27 DIAGNOSIS — Y939 Activity, unspecified: Secondary | ICD-10-CM | POA: Insufficient documentation

## 2017-01-27 NOTE — ED Triage Notes (Signed)
Pt states bugs have been biting her for several weeks. Pt state she swears she can see them crawling in her skin... Several lesions noted to arms and legs.

## 2017-01-28 ENCOUNTER — Emergency Department (HOSPITAL_COMMUNITY)
Admission: EM | Admit: 2017-01-28 | Discharge: 2017-01-28 | Disposition: A | Discharge status: Home / Self Care | Payer: Medicaid Other | Attending: Emergency Medicine | Admitting: Emergency Medicine

## 2017-01-28 DIAGNOSIS — W57XXXA Bitten or stung by nonvenomous insect and other nonvenomous arthropods, initial encounter: Secondary | ICD-10-CM

## 2017-01-28 MED ORDER — PERMETHRIN 5 % EX CREA: TOPICAL_CREAM | CUTANEOUS | 0 refills | Status: DC

## 2017-01-28 MED ORDER — HYDROXYZINE HCL 25 MG PO TABS: 25.0000 mg | ORAL_TABLET | Freq: Four times a day (QID) | ORAL | 0 refills | Status: DC | PRN

## 2017-01-28 NOTE — Discharge Instructions (Signed)
Contact in exterminator if symptoms persist. You may try permethrin cream as prescribed for treatment of potential scabies. Take Atarax as prescribed for itching.

## 2017-01-28 NOTE — ED Provider Notes (Signed)
[facility] Note   CSN: [id number] Arrival date & time: 01/27/17  2236     History   Chief Complaint Chief Complaint  Patient presents with  . Skin Ulcer    HPI Diana Nolan is a 51 y.o. female.  51 year old female presents to the emergency department for evaluation of bug bites. She states that bugs have been biting her for several weeks. She states that she can see them flying around and landing on her skin, subsequently bearing himself underneath her skin. She states that she has tried to "scraped them out"without relief. She has continued to have itching at areas of her alleged bite sites. She reports being under increased stress lately secondary to the passing of her father. She has not had any difficulty breathing, swallowing. No fevers.   The history is provided by the patient. No language interpreter was used.    History reviewed. No pertinent past medical history.  There are no active problems to display for this patient.   Past Surgical History:  Procedure Laterality Date  . KNEE SURGERY      OB History    No data available       Home Medications    Prior to Admission medications   Medication Sig Start Date End Date Taking? Authorizing Provider  hydrOXYzine (ATARAX/VISTARIL) 25 MG tablet Take 1 tablet (25 mg total) by mouth every 6 (six) hours as needed for itching. 01/28/17   Antony Madura, PA-C  methylPREDNISolone (MEDROL DOSEPAK) 4 MG TBPK tablet Take as directed 01/09/17   Lucia Estelle, NP  permethrin (ELIMITE) 5 % cream Apply to entire body other than face - let sit for 14 hours then wash off, may repeat in 1 week if still having symptoms 01/28/17   Antony Madura, PA-C  predniSONE (DELTASONE) 20 MG tablet 3 Tabs PO Days 1-3, then 2 tabs PO Days 4-6, then 1 tab PO Day 7-9, then Half Tab PO Day 10-12 12/23/16   Hayden Rasmussen, NP  triamcinolone cream (KENALOG) 0.1 % Apply 1 application topically 2 (two) times daily. 12/23/16   Hayden Rasmussen, NP     Family History No family history on file.  Social History Social History  Substance Use Topics  . Smoking status: Current Some Day Smoker    Packs/day: 1.00    Types: Cigarettes  . Smokeless tobacco: Never Used  . Alcohol use No     Allergies   Hydrocodone   Review of Systems Review of Systems Ten systems reviewed and are negative for acute change, except as noted in the HPI.    Physical Exam Updated Vital Signs BP 137/82 (BP Location: Right Arm)   Pulse 82   Temp 98.1 F (36.7 C) (Oral)   Resp 16   LMP 11/06/2014   SpO2 97%   Physical Exam  Constitutional: She is oriented to person, place, and time. She appears well-developed and well-nourished. No distress.  Nontoxic and in NAD  HENT:  Head: Normocephalic and atraumatic.  No angioedema. Patient tolerating secretions without difficulty.  Eyes: Conjunctivae and EOM are normal. No scleral icterus.  Neck: Normal range of motion.  Pulmonary/Chest: Effort normal. No respiratory distress.  Musculoskeletal: Normal range of motion.  Neurological: She is alert and oriented to person, place, and time. She exhibits normal muscle tone. Coordination normal.  Skin: Skin is warm and dry. Rash noted. She is not diaphoretic. No erythema. No pallor.  Multiple areas of excoriation to extremities. No associated erythema, induration, drainage.  Psychiatric: Her  behavior is normal. Her mood appears anxious.  Nursing note and vitals reviewed.    ED Treatments / Results  Labs (all labs ordered are listed, but only abnormal results are displayed) Labs Reviewed - No data to display  EKG  EKG Interpretation None       Radiology No results found.  Procedures Procedures (including critical care time)  Medications Ordered in ED Medications - No data to display   Initial Impression / Assessment and Plan / ED Course  I have reviewed the triage vital signs and the nursing notes.  Pertinent labs & imaging results that  were available during my care of the patient were reviewed by me and considered in my medical decision making (see chart for details).     51 year old female presents to the emergency department for bug bites. There are multiple areas of excoriation, similar to foot is taken on 01/09/2017. No evidence of secondary infection or cellulitis. It appears the patient was treated previously for scabies, but with only 1 round of medication. 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 She has no documented psychiatric history that I can see and presented infrequently to the ED until the end of August 2018. I have explained to the patient that she will need to contact an exterminator if her symptoms persist and she remains concerned about bug infestation. Atarax prescribed for itching. Return precautions discussed and provided. Patient discharged in stable condition with no unaddressed concerns.   Final Clinical Impressions(s) / ED Diagnoses   Final diagnoses:  Bug bite, initial encounter    New Prescriptions New Prescriptions   HYDROXYZINE (ATARAX/VISTARIL) 25 MG TABLET    Take 1 tablet (25 mg total) by mouth every 6 (six) hours as needed for itching.   PERMETHRIN (ELIMITE) 5 % CREAM    Apply to entire body other than face - let sit for 14 hours then wash off, may repeat in 1 week if still having symptoms     Antony Madura, PA-C 01/28/17 0209    Zadie Rhine, MD 01/28/17 [phone]

## 2017-03-28 ENCOUNTER — Emergency Department (HOSPITAL_COMMUNITY)
Admission: EM | Admit: 2017-03-28 | Discharge: 2017-03-28 | Disposition: A | Discharge status: Home / Self Care | Payer: Medicaid Other | Attending: Emergency Medicine | Admitting: Emergency Medicine

## 2017-03-28 ENCOUNTER — Encounter (HOSPITAL_COMMUNITY): Payer: Self-pay | Admitting: *Deleted

## 2017-03-28 DIAGNOSIS — F1110 Opioid abuse, uncomplicated: Secondary | ICD-10-CM | POA: Diagnosis not present

## 2017-03-28 DIAGNOSIS — Z79899 Other long term (current) drug therapy: Secondary | ICD-10-CM | POA: Diagnosis not present

## 2017-03-28 DIAGNOSIS — F17210 Nicotine dependence, cigarettes, uncomplicated: Secondary | ICD-10-CM | POA: Insufficient documentation

## 2017-03-28 DIAGNOSIS — R21 Rash and other nonspecific skin eruption: Secondary | ICD-10-CM

## 2017-03-28 MED ORDER — HYDROXYZINE HCL 25 MG PO TABS
25.0000 mg | ORAL_TABLET | Freq: Once | ORAL | Status: AC
  Administered 2017-03-28: 25 mg via ORAL
  Filled 2017-03-28: qty 1

## 2017-03-28 MED ORDER — HYDROXYZINE HCL 25 MG PO TABS: 25.0000 mg | ORAL_TABLET | Freq: Four times a day (QID) | ORAL | 0 refills | Status: DC

## 2017-03-28 MED ORDER — DIPHENHYDRAMINE HCL 25 MG PO CAPS
25.0000 mg | ORAL_CAPSULE | Freq: Once | ORAL | Status: AC
  Administered 2017-03-28: 25 mg via ORAL
  Filled 2017-03-28: qty 1

## 2017-03-28 NOTE — ED Provider Notes (Signed)
[facility] Provider Note   CSN: [id number] Arrival date & time: 03/28/17  1801     History   Chief Complaint Chief Complaint  Patient presents with  . Rash    HPI Diana Nolan is a 51 y.o. female who presents to the emergency department with a chief complaint of itchy rash for the last 3 months to her bilateral arms, the superior part of her back, and bilateral legs. She states "I think I have got worms in my skin.  I can feel them crawling out of me."  No aggravating or alleviating symptoms.  She reports that she has been seen by urgent care in the emergency department multiple times without improvement of the rash.  She denies fever, chills, visual changes, or headache.  She does report intermittent tingling in her bilateral hands.  No numbness, weakness, or polyuria.   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  She denies methamphetamine or cocaine use.  No history of diabetes mellitus.  No sick contacts.  No new soaps, lotions, detergents, foods, or sleeping environments.  With PCP.  The history is provided by the patient. No language interpreter was used.  Rash   This is a chronic problem. The current episode started more than 1 week ago. The problem has not changed since onset.There has been no fever. The rash is present on the left arm, neck, right lower leg, left lower leg and right arm. The pain has been constant since onset. Associated symptoms include itching and pain. Pertinent negatives include no blisters and no weeping. Treatments tried: Permethrin treatment. The treatment provided no relief. Risk factors: Heroin use.    History reviewed. No pertinent past medical history.  There are no active problems to display for this patient.   Past Surgical History:  Procedure Laterality  Date  . KNEE SURGERY      OB History    No data available       Home Medications    Prior to Admission medications   Medication Sig Start Date End Date Taking? Authorizing Provider  hydrOXYzine (ATARAX/VISTARIL) 25 MG tablet Take 1 tablet (25 mg total) by mouth every 6 (six) hours. 03/28/17   Denisia Harpole A, PA-C  methylPREDNISolone (MEDROL DOSEPAK) 4 MG TBPK tablet Take as directed 01/09/17   Lucia EstelleZheng, Feng, NP  permethrin (ELIMITE) 5 % cream Apply to entire body other than face - let sit for 14 hours then wash off, may repeat in 1 week if still having symptoms 01/28/17   Antony MaduraHumes, Kelly, PA-C  predniSONE (DELTASONE) 20 MG tablet 3 Tabs PO Days 1-3, then 2 tabs PO Days 4-6, then 1 tab PO Day 7-9, then Half Tab PO Day 10-12 12/23/16   Hayden RasmussenMabe, David, NP  triamcinolone cream (KENALOG) 0.1 % Apply 1 application topically 2 (two) times daily. 12/23/16   Hayden RasmussenMabe, David, NP    Family History History reviewed. No pertinent family history.  Social History Social History   Tobacco Use  . Smoking status: Current Some Day Smoker    Packs/day: 1.00    Types: Cigarettes  . Smokeless tobacco: Never Used  Substance Use Topics  . Alcohol use: No  . Drug use: No     Allergies   Hydrocodone   Review of Systems Review of Systems  Constitutional: Negative for  activity change, chills and fever.  Respiratory: Negative for shortness of breath.   Cardiovascular: Negative for chest pain.  Gastrointestinal: Negative for abdominal pain.  Endocrine: Negative for polyuria.  Musculoskeletal: Negative for back pain.  Skin: Positive for itching and rash.  Neurological: Negative for weakness and numbness.       Paresthesias.   Psychiatric/Behavioral: The patient is nervous/anxious.      Physical Exam Updated Vital Signs BP (!) 154/99 (BP Location: Right Arm)   Pulse 100   Temp 98.3 F (36.8 C) (Oral)   Resp 16   Ht 5\' 2"  (1.575 m)   Wt 59 kg (130 lb)   LMP 11/06/2014   SpO2 97%   BMI 23.78 kg/m     Physical Exam  Constitutional: No distress.  HENT:  Head: Normocephalic.  Poor dentition.  Eyes: Conjunctivae are normal.  Neck: Neck supple.  Cardiovascular: Normal rate, regular rhythm and normal heart sounds. Exam reveals no gallop and no friction rub.  No murmur heard. Pulmonary/Chest: Effort normal and breath sounds normal. No stridor. No respiratory distress. She has no wheezes. She has no rales.  Abdominal: Soft. She exhibits no distension.  Musculoskeletal:  No track marks noted.  Neurological: She is alert.  Skin: Skin is warm. Rash noted.  Numerous excoriations with surrounding scratch marks noted to the bilateral arms, bilateral lower legs, and superior aspect of the posterior neck.  The mid and lower back, abdomen, and bilateral thighs are unremarkable.  No evidence of cellulitis, warmth, or edema.  No pustules, vesicles, bullae, or desquamation.  Psychiatric: Her behavior is normal.  Nursing note and vitals reviewed.    ED Treatments / Results  Labs (all labs ordered are listed, but only abnormal results are displayed) Labs Reviewed - No data to display  EKG  EKG Interpretation None       Radiology No results found.  Procedures Procedures (including critical care time)  Medications Ordered in ED Medications  diphenhydrAMINE (BENADRYL) capsule 25 mg (25 mg Oral Given 03/28/17 2128)  hydrOXYzine (ATARAX/VISTARIL) tablet 25 mg (25 mg Oral Given 03/28/17 2128)     Initial Impression / Assessment and Plan / ED Course  I have reviewed the triage vital signs and the nursing notes.  Pertinent labs & imaging results that were available during my care of the patient were reviewed by me and considered in my medical decision making (see chart for details).     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  She also complains of intermittent  paresthesias in the bilateral hands.  She has been seen in the emergency department in urgent care multiple times since the onset of symptoms.  I question delusional parasitosis; however given that the patient is not established with primary care, I think it is warranted that the patient have labs checked to ensure that this is not secondary to vitamin B12 deficiency, HIV, or other organic cause.  Doubt parasitic infection, cellulitis, or scabies at this time. Given the chronicity of the patient's symptoms, I do not feel that an emergent workup is required.  Atarax and Benadryl given in the emergency department for symptomatic treatment.  Discussed with the patient that sometimes symptoms can be secondary to heroin or other IV drug use.  The patient adamantly denies methamphetamine use.  On physical exam, the patient has poor dentition, periodontal disease, and the rash is more prevalent in  areas that are easily scratched, also more concerning for delusional parasitosis.  Will discharge the patient with Atarax and PCP follow-up who can refer the patient to dermatology if indicated.  Strict return precautions given.  No acute distress.  The patient is safe for discharge at this time.   Final Clinical Impressions(s) / ED Diagnoses   Final diagnoses:  Rash and nonspecific skin eruption    ED Discharge Orders        Ordered    hydrOXYzine (ATARAX/VISTARIL) 25 MG tablet  Every 6 hours     03/28/17 2131       Frederik PearMcDonald, Trinitee Horgan A, PA-C 03/28/17 2140    Alvira MondaySchlossman, Erin, MD 03/29/17 1515

## 2017-03-28 NOTE — Discharge Instructions (Signed)
Please call the number on your discharge paperwork to get established with primary care.  You may need to have some blood work such as checking her vitamin B12 level and your A1c checked.  Primary care patient provided with a referral to dermatologist if your symptoms do not improve.  Take 1 tablet of Atarax every 6 hours as needed for itching.  You can apply hydrocortisone cream, which is available over-the-counter, to the rash as directed on the label.  Please make sure to clean the rash once a day with warm soap and water.  You can also take Benadryl as directed on the bottle to help with your symptoms.  If you develop a fever, or if the rash get red and swollen, or if you develop new or other concerning symptoms, please return to the emergency department for reevaluation.

## 2017-03-28 NOTE — ED Triage Notes (Signed)
Pt reports rash to entire body, including arms, legs and now effecting her mouth and teeth. Pt thinks its worms.

## 2017-12-20 ENCOUNTER — Other Ambulatory Visit: Payer: Self-pay

## 2017-12-20 ENCOUNTER — Emergency Department (HOSPITAL_COMMUNITY): Payer: No Typology Code available for payment source

## 2017-12-20 ENCOUNTER — Encounter (HOSPITAL_COMMUNITY): Payer: Self-pay | Admitting: Emergency Medicine

## 2017-12-20 ENCOUNTER — Emergency Department (HOSPITAL_COMMUNITY)
Admission: EM | Admit: 2017-12-20 | Discharge: 2017-12-20 | Disposition: A | Discharge status: Home / Self Care | Payer: No Typology Code available for payment source | Attending: Emergency Medicine | Admitting: Emergency Medicine

## 2017-12-20 DIAGNOSIS — M542 Cervicalgia: Secondary | ICD-10-CM | POA: Insufficient documentation

## 2017-12-20 DIAGNOSIS — M7918 Myalgia, other site: Secondary | ICD-10-CM | POA: Insufficient documentation

## 2017-12-20 DIAGNOSIS — Y999 Unspecified external cause status: Secondary | ICD-10-CM | POA: Insufficient documentation

## 2017-12-20 DIAGNOSIS — Y92410 Unspecified street and highway as the place of occurrence of the external cause: Secondary | ICD-10-CM | POA: Diagnosis not present

## 2017-12-20 DIAGNOSIS — M549 Dorsalgia, unspecified: Secondary | ICD-10-CM | POA: Diagnosis not present

## 2017-12-20 DIAGNOSIS — M25552 Pain in left hip: Secondary | ICD-10-CM | POA: Diagnosis not present

## 2017-12-20 DIAGNOSIS — Y9389 Activity, other specified: Secondary | ICD-10-CM | POA: Diagnosis not present

## 2017-12-20 LAB — CBC WITH DIFFERENTIAL/PLATELET
ABS IMMATURE GRANULOCYTES: 0 10*3/uL (ref 0.0–0.1)
Basophils Absolute: 0 10*3/uL (ref 0.0–0.1)
Basophils Relative: 0 %
Eosinophils Absolute: 0.1 10*3/uL (ref 0.0–0.7)
Eosinophils Relative: 1 %
HCT: 43.5 % (ref 36.0–46.0)
HEMOGLOBIN: 13.7 g/dL (ref 12.0–15.0)
Immature Granulocytes: 0 %
LYMPHS PCT: 26 %
Lymphs Abs: 2.2 10*3/uL (ref 0.7–4.0)
MCH: 28.6 pg (ref 26.0–34.0)
MCHC: 31.5 g/dL (ref 30.0–36.0)
MCV: 90.8 fL (ref 78.0–100.0)
MONO ABS: 0.5 10*3/uL (ref 0.1–1.0)
MONOS PCT: 6 %
NEUTROS ABS: 5.8 10*3/uL (ref 1.7–7.7)
Neutrophils Relative %: 67 %
Platelets: 304 10*3/uL (ref 150–400)
RBC: 4.79 MIL/uL (ref 3.87–5.11)
RDW: 13.9 % (ref 11.5–15.5)
WBC: 8.6 10*3/uL (ref 4.0–10.5)

## 2017-12-20 LAB — URINALYSIS, ROUTINE W REFLEX MICROSCOPIC
BILIRUBIN URINE: NEGATIVE
Glucose, UA: NEGATIVE mg/dL
Ketones, ur: NEGATIVE mg/dL
Nitrite: POSITIVE — AB
Protein, ur: NEGATIVE mg/dL
SPECIFIC GRAVITY, URINE: 1.024 (ref 1.005–1.030)
pH: 6 (ref 5.0–8.0)

## 2017-12-20 LAB — COMPREHENSIVE METABOLIC PANEL
ALBUMIN: 3.8 g/dL (ref 3.5–5.0)
ALK PHOS: 72 U/L (ref 38–126)
ALT: 11 U/L (ref 0–44)
ANION GAP: 9 (ref 5–15)
AST: 15 U/L (ref 15–41)
BUN: 6 mg/dL (ref 6–20)
CALCIUM: 9.3 mg/dL (ref 8.9–10.3)
CO2: 26 mmol/L (ref 22–32)
Chloride: 104 mmol/L (ref 98–111)
Creatinine, Ser: 0.76 mg/dL (ref 0.44–1.00)
GFR calc Af Amer: >60 mL/min (ref >60)
GFR calc non Af Amer: >60 mL/min (ref >60)
GLUCOSE: 104 mg/dL — AB (ref 70–99)
Potassium: 3.5 mmol/L (ref 3.5–5.1)
SODIUM: 139 mmol/L (ref 135–145)
Total Bilirubin: 0.9 mg/dL (ref 0.3–1.2)
Total Protein: 7.1 g/dL (ref 6.5–8.1)

## 2017-12-20 LAB — I-STAT CHEM 8, ED
BUN: 6 mg/dL (ref 6–20)
CALCIUM ION: 1.17 mmol/L (ref 1.15–1.40)
Chloride: 102 mmol/L (ref 98–111)
Creatinine, Ser: 0.7 mg/dL (ref 0.44–1.00)
Glucose, Bld: 102 mg/dL — ABNORMAL HIGH (ref 70–99)
HCT: 42 % (ref 36.0–46.0)
Hemoglobin: 14.3 g/dL (ref 12.0–15.0)
Potassium: 3.6 mmol/L (ref 3.5–5.1)
SODIUM: 139 mmol/L (ref 135–145)
TCO2: 28 mmol/L (ref 22–32)

## 2017-12-20 LAB — RAPID URINE DRUG SCREEN, HOSP PERFORMED
Amphetamines: NOT DETECTED
Barbiturates: NOT DETECTED
Benzodiazepines: NOT DETECTED
Cocaine: POSITIVE — AB
OPIATES: POSITIVE — AB
TETRAHYDROCANNABINOL: NOT DETECTED

## 2017-12-20 LAB — ETHANOL

## 2017-12-20 LAB — I-STAT BETA HCG BLOOD, ED (MC, WL, AP ONLY)

## 2017-12-20 LAB — ABO/RH: ABO/RH(D): O NEG

## 2017-12-20 LAB — PROTIME-INR
INR: 1.01
Prothrombin Time: 13.2 seconds (ref 11.4–15.2)

## 2017-12-20 MED ORDER — MORPHINE SULFATE (PF) 4 MG/ML IV SOLN
4.0000 mg | Freq: Once | INTRAVENOUS | Status: AC
  Administered 2017-12-20: 4 mg via INTRAVENOUS
  Filled 2017-12-20: qty 1

## 2017-12-20 MED ORDER — TRAMADOL HCL 50 MG PO TABS: 50.0000 mg | ORAL_TABLET | Freq: Four times a day (QID) | ORAL | 0 refills | Status: DC | PRN

## 2017-12-20 MED ORDER — IOPAMIDOL (ISOVUE-300) INJECTION 61%
INTRAVENOUS | Status: AC
  Administered 2017-12-20: 100 mL via INTRAVENOUS
  Filled 2017-12-20: qty 100
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
## 2017-12-20 NOTE — Progress Notes (Signed)
Orthopedic Tech Progress Note Patient Details:  Diana Nolan Engelmann 13-Dec-1965 [id number]  Patient ID: Diana Nolan Klump, female   DOB: 13-Dec-1965, 52 y.o.   MRN: [id number]   Saul FordyceJennifer C Jermaine Tholl 12/20/2017, 9:57 AMLevel 2 Trauma.

## 2017-12-20 NOTE — Discharge Instructions (Signed)
Return for any problem.  Follow-up with your regular doctor as instructed. °

## 2017-12-21 LAB — TYPE AND SCREEN
ABO/RH(D): O NEG
ANTIBODY SCREEN: NEGATIVE
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
## 2019-09-06 ENCOUNTER — Ambulatory Visit: Payer: Medicaid Other | Attending: Internal Medicine

## 2019-09-06 DIAGNOSIS — Z23 Encounter for immunization: Secondary | ICD-10-CM

## 2019-09-06 NOTE — Progress Notes (Signed)
   Covid-19 Vaccination Clinic  Name:  Reginna Sermeno    MRN: [id number] DOB: 07/21/1965  09/06/2019  Ms. Caven was observed post Covid-19 immunization for 15 minutes without incident. She was provided with Vaccine Information Sheet and instruction to access the V-Safe system.   Ms. Christenson was instructed to call 911 with any severe reactions post vaccine: Marland Kitchen Difficulty breathing  . Swelling of face and throat  . A fast heartbeat  . A bad rash all over body  . Dizziness and weakness   Immunizations Administered    Name Date Dose VIS Date Route   Pfizer COVID-19 Vaccine 09/06/2019 11:39 AM 0.3 mL 06/28/2018 Intramuscular   Manufacturer: [employer], [employer]   Lot: [id number]   NDC: [address]-9

## 2019-09-08 ENCOUNTER — Ambulatory Visit: Payer: Medicaid Other

## 2019-10-03 ENCOUNTER — Ambulatory Visit: Payer: No Typology Code available for payment source | Attending: Internal Medicine

## 2022-12-07 NOTE — H&P (Signed)
Patient: Afrin Stastny  PID: [id number]  DOB: 04-Jul-1965  SEX: Female   Patient referred by  DDS  for extraction teeth  CC: Bad teeth. Been hurting for 2 years.  Past Medical History:  Smoker    Medications: None    Allergies:     None    Surgeries:   Oral Surgery     Social History       Smoking:   1 ppd         Alcohol: Drug use:                             Exam: BMI 27. Gross decay all remaining teeth # 3, 5, 6, 7, 8, 9, 10, 11, 12, 13, 14, 22, 23, 24, 25, 26, 27, 28. Right mandibular lingual torus. No purulence, edema, fluctuance, trismus. Oral cancer screening negative. Pharynx clear. No lymphadenopathy.  Panorex:Gross decay teeth # 3, 5, 6, 7, 8, 9, 10, 11, 12, 13, 14, 22, 23, 24, 25, 26, 27, 28.  Assessment: ASA . Non-restorable  teeth # 3, 5, 6, 7, 8, 9, 10, 11, 12, 13, 14, 22, 23, 24, 25, 26, 27, 28. Right mandibular lingual torus.              Plan: Extraction Teeth #   3, 5, 6, 7, 8, 9, 10, 11, 12, 13, 14, 22, 23, 24, 25, 26, 27, 28. Alveoloplasty. Removal right mandibular lingual torus.  Hospital Day surgery.                 Rx: n               Risks and complications explained. Questions answered.   Georgia Lopes, DMD

## 2022-12-10 ENCOUNTER — Other Ambulatory Visit: Payer: Self-pay

## 2022-12-10 ENCOUNTER — Encounter (HOSPITAL_COMMUNITY): Payer: Self-pay | Admitting: Oral Surgery

## 2022-12-10 NOTE — Anesthesia Preprocedure Evaluation (Addendum)
Anesthesia Evaluation  Patient identified by MRN, date of birth, ID band Patient awake    Reviewed: Allergy & Precautions, NPO status , Patient's Chart, lab work & pertinent test results  History of Anesthesia Complications Negative for: history of anesthetic complications  Airway Mallampati: II  TM Distance: >3 FB Neck ROM: Full    Dental  (+) Dental Advisory Given, Chipped, Missing, Poor Dentition   Pulmonary Current Smoker and Patient abstained from smoking.   Pulmonary exam normal        Cardiovascular negative cardio ROS Normal cardiovascular exam     Neuro/Psych negative neurological ROS  negative psych ROS   GI/Hepatic negative GI ROS, Neg liver ROS,,,  Endo/Other  negative endocrine ROS    Renal/GU negative Renal ROS     Musculoskeletal negative musculoskeletal ROS (+)    Abdominal   Peds  Hematology negative hematology ROS (+)   Anesthesia Other Findings   Reproductive/Obstetrics                             Anesthesia Physical Anesthesia Plan  ASA: 2  Anesthesia Plan: General   Post-op Pain Management: Tylenol PO (pre-op)* and Minimal or no pain anticipated   Induction: Intravenous  PONV Risk Score and Plan: 2 and Treatment may vary due to age or medical condition, Ondansetron, Dexamethasone and Midazolam  Airway Management Planned: Nasal ETT  Additional Equipment: None  Intra-op Plan:   Post-operative Plan: Extubation in OR  Informed Consent: I have reviewed the patients History and Physical, chart, labs and discussed the procedure including the risks, benefits and alternatives for the proposed anesthesia with the patient or authorized representative who has indicated his/her understanding and acceptance.     Dental advisory given  Plan Discussed with: CRNA and Anesthesiologist  Anesthesia Plan Comments:         Anesthesia Quick Evaluation

## 2022-12-10 NOTE — Progress Notes (Signed)
Diana Nolan denies chest pain or shortness of breath. Patient denies having any s/s of Covid in her household, also denies any known exposure to Covid. Diana Nolan denies any s/s of upper or lower respiratory infection in the past 8 weeks.   Diana Nolan states that PCP is Dr Tanya Nones with [facility].  I do not see that patient has seen Dr. Tanya Nones in several years.

## 2022-12-11 ENCOUNTER — Ambulatory Visit (HOSPITAL_COMMUNITY)
Admission: RE | Admit: 2022-12-11 | Discharge: 2022-12-11 | Disposition: A | Discharge status: Home / Self Care | Payer: Medicaid Other | Source: Ambulatory Visit | Attending: Oral Surgery | Admitting: Oral Surgery

## 2022-12-11 ENCOUNTER — Encounter (HOSPITAL_COMMUNITY): Payer: Self-pay | Admitting: Oral Surgery

## 2022-12-11 ENCOUNTER — Ambulatory Visit (HOSPITAL_BASED_OUTPATIENT_CLINIC_OR_DEPARTMENT_OTHER): Payer: Medicaid Other | Admitting: Anesthesiology

## 2022-12-11 ENCOUNTER — Ambulatory Visit (HOSPITAL_COMMUNITY): Payer: Medicaid Other | Admitting: Anesthesiology

## 2022-12-11 ENCOUNTER — Other Ambulatory Visit: Payer: Self-pay

## 2022-12-11 ENCOUNTER — Encounter (HOSPITAL_COMMUNITY)
Admission: RE | Disposition: A | Discharge status: Home / Self Care | Payer: Self-pay | Source: Ambulatory Visit | Attending: Oral Surgery

## 2022-12-11 DIAGNOSIS — K056 Periodontal disease, unspecified: Secondary | ICD-10-CM | POA: Diagnosis not present

## 2022-12-11 DIAGNOSIS — M270 Developmental disorders of jaws: Secondary | ICD-10-CM | POA: Diagnosis not present

## 2022-12-11 DIAGNOSIS — F17200 Nicotine dependence, unspecified, uncomplicated: Secondary | ICD-10-CM | POA: Diagnosis not present

## 2022-12-11 DIAGNOSIS — K029 Dental caries, unspecified: Secondary | ICD-10-CM | POA: Diagnosis present

## 2022-12-11 DIAGNOSIS — K0859 Other unsatisfactory restoration of tooth: Secondary | ICD-10-CM

## 2022-12-11 HISTORY — PX: TOOTH EXTRACTION: SHX859

## 2022-12-11 HISTORY — DX: Other specified health status: Z78.9

## 2022-12-11 HISTORY — DX: Other complications of anesthesia, initial encounter: T88.59XA

## 2022-12-11 LAB — CBC
HCT: 40.3 % (ref 36.0–46.0)
Hemoglobin: 13.4 g/dL (ref 12.0–15.0)
MCH: 29.2 pg (ref 26.0–34.0)
MCHC: 33.3 g/dL (ref 30.0–36.0)
MCV: 87.8 fL (ref 80.0–100.0)
Platelets: 296 10*3/uL (ref 150–400)
RBC: 4.59 MIL/uL (ref 3.87–5.11)
RDW: 13.8 % (ref 11.5–15.5)
WBC: 8.6 10*3/uL (ref 4.0–10.5)
nRBC: 0 % (ref 0.0–0.2)

## 2022-12-11 SURGERY — DENTAL RESTORATION/EXTRACTIONS
Anesthesia: General

## 2022-12-11 MED ORDER — PROPOFOL 10 MG/ML IV BOLUS
INTRAVENOUS | Status: AC
  Filled 2022-12-11: qty 20

## 2022-12-11 MED ORDER — MIDAZOLAM HCL 2 MG/2ML IJ SOLN
INTRAMUSCULAR | Status: AC
  Filled 2022-12-11: qty 2

## 2022-12-11 MED ORDER — LIDOCAINE-EPINEPHRINE 2 %-1:100000 IJ SOLN
INTRAMUSCULAR | Status: DC | PRN
  Administered 2022-12-11: 20 mL via INTRADERMAL

## 2022-12-11 MED ORDER — OXYMETAZOLINE HCL 0.05 % NA SOLN
NASAL | Status: DC | PRN
Start: 2022-12-11 — End: 2022-12-11
  Administered 2022-12-11 (×2): 2 via NASAL

## 2022-12-11 MED ORDER — LIDOCAINE 2% (20 MG/ML) 5 ML SYRINGE
INTRAMUSCULAR | Status: AC
  Filled 2022-12-11: qty 5

## 2022-12-11 MED ORDER — LIDOCAINE-EPINEPHRINE 2 %-1:100000 IJ SOLN
INTRAMUSCULAR | Status: AC
  Filled 2022-12-11: qty 5

## 2022-12-11 MED ORDER — ACETAMINOPHEN 500 MG PO TABS: 1000.0000 mg | ORAL_TABLET | Freq: Once | ORAL | Status: AC

## 2022-12-11 MED ORDER — MIDAZOLAM HCL 2 MG/2ML IJ SOLN
INTRAMUSCULAR | Status: DC | PRN
  Administered 2022-12-11: 2 mg via INTRAVENOUS

## 2022-12-11 MED ORDER — ORAL CARE MOUTH RINSE: 15.0000 mL | Freq: Once | OROMUCOSAL | Status: AC

## 2022-12-11 MED ORDER — DEXAMETHASONE SODIUM PHOSPHATE 10 MG/ML IJ SOLN
INTRAMUSCULAR | Status: DC | PRN
  Administered 2022-12-11: 10 mg via INTRAVENOUS

## 2022-12-11 MED ORDER — LACTATED RINGERS IV SOLN: INTRAVENOUS | Status: DC

## 2022-12-11 MED ORDER — FENTANYL CITRATE (PF) 100 MCG/2ML IJ SOLN: 25.0000 ug | INTRAMUSCULAR | Status: DC | PRN

## 2022-12-11 MED ORDER — FENTANYL CITRATE (PF) 250 MCG/5ML IJ SOLN
INTRAMUSCULAR | Status: DC | PRN
  Administered 2022-12-11 (×2): 50 ug via INTRAVENOUS

## 2022-12-11 MED ORDER — SUCCINYLCHOLINE CHLORIDE 200 MG/10ML IV SOSY
PREFILLED_SYRINGE | INTRAVENOUS | Status: AC
  Filled 2022-12-11: qty 10

## 2022-12-11 MED ORDER — CHLORHEXIDINE GLUCONATE 0.12 % MT SOLN
OROMUCOSAL | Status: AC
  Administered 2022-12-11: 15 mL via OROMUCOSAL
  Filled 2022-12-11: qty 15

## 2022-12-11 MED ORDER — OXYCODONE HCL 5 MG/5ML PO SOLN
ORAL | Status: AC
  Administered 2022-12-11: 5 mg
  Filled 2022-12-11: qty 5

## 2022-12-11 MED ORDER — 0.9 % SODIUM CHLORIDE (POUR BTL) OPTIME
TOPICAL | Status: DC | PRN
  Administered 2022-12-11: 1000 mL

## 2022-12-11 MED ORDER — DEXAMETHASONE SODIUM PHOSPHATE 10 MG/ML IJ SOLN
INTRAMUSCULAR | Status: AC
  Filled 2022-12-11: qty 1

## 2022-12-11 MED ORDER — FENTANYL CITRATE (PF) 250 MCG/5ML IJ SOLN
INTRAMUSCULAR | Status: AC
  Filled 2022-12-11: qty 5

## 2022-12-11 MED ORDER — OXYCODONE HCL 5 MG PO TABS: 5.0000 mg | ORAL_TABLET | Freq: Once | ORAL | Status: DC | PRN

## 2022-12-11 MED ORDER — OXYCODONE HCL 5 MG/5ML PO SOLN: 5.0000 mg | Freq: Once | ORAL | Status: DC | PRN

## 2022-12-11 MED ORDER — OXYCODONE-ACETAMINOPHEN 5-325 MG PO TABS
1.0000 | ORAL_TABLET | ORAL | 0 refills | Status: AC | PRN
Start: 2022-12-11 — End: ?

## 2022-12-11 MED ORDER — SUCCINYLCHOLINE CHLORIDE 200 MG/10ML IV SOSY
PREFILLED_SYRINGE | INTRAVENOUS | Status: DC | PRN
  Administered 2022-12-11: 120 mg via INTRAVENOUS

## 2022-12-11 MED ORDER — FENTANYL CITRATE (PF) 100 MCG/2ML IJ SOLN
INTRAMUSCULAR | Status: AC
  Filled 2022-12-11: qty 2

## 2022-12-11 MED ORDER — CHLORHEXIDINE GLUCONATE 0.12 % MT SOLN: 15.0000 mL | Freq: Once | OROMUCOSAL | Status: AC

## 2022-12-11 MED ORDER — OXYMETAZOLINE HCL 0.05 % NA SOLN
NASAL | Status: AC
  Filled 2022-12-11: qty 30

## 2022-12-11 MED ORDER — PROPOFOL 10 MG/ML IV BOLUS
INTRAVENOUS | Status: DC | PRN
Start: 2022-12-11 — End: 2022-12-11
  Administered 2022-12-11: 50 mg via INTRAVENOUS
  Administered 2022-12-11: 150 mg via INTRAVENOUS

## 2022-12-11 MED ORDER — LIDOCAINE 2% (20 MG/ML) 5 ML SYRINGE
INTRAMUSCULAR | Status: DC | PRN
  Administered 2022-12-11: 60 mg via INTRAVENOUS

## 2022-12-11 MED ORDER — AMOXICILLIN 500 MG PO CAPS: 500.0000 mg | ORAL_CAPSULE | Freq: Three times a day (TID) | ORAL | 0 refills | Status: AC

## 2022-12-11 MED ORDER — PROMETHAZINE HCL 25 MG/ML IJ SOLN: 6.2500 mg | INTRAMUSCULAR | Status: DC | PRN

## 2022-12-11 MED ORDER — SODIUM CHLORIDE 0.9 % IR SOLN
Status: DC | PRN
  Administered 2022-12-11: 500 mL

## 2022-12-11 MED ORDER — ONDANSETRON HCL 4 MG/2ML IJ SOLN
INTRAMUSCULAR | Status: AC
  Filled 2022-12-11: qty 2

## 2022-12-11 MED ORDER — CEFAZOLIN SODIUM-DEXTROSE 2-4 GM/100ML-% IV SOLN
2.0000 g | INTRAVENOUS | Status: AC
  Administered 2022-12-11: 2 g via INTRAVENOUS
  Filled 2022-12-11: qty 100

## 2022-12-11 MED ORDER — ACETAMINOPHEN 500 MG PO TABS
ORAL_TABLET | ORAL | Status: AC
  Administered 2022-12-11: 1000 mg via ORAL
  Filled 2022-12-11: qty 2

## 2022-12-11 MED ORDER — ONDANSETRON HCL 4 MG/2ML IJ SOLN
INTRAMUSCULAR | Status: DC | PRN
Start: 2022-12-11 — End: 2022-12-11
  Administered 2022-12-11: 4 mg via INTRAVENOUS

## 2022-12-11 SURGICAL SUPPLY — 35 items
BAG COUNTER SPONGE SURGICOUNT (BAG) IMPLANT
BAG SPNG CNTER NS LX DISP (BAG)
BLADE SURG 15 STRL LF DISP TIS (BLADE) ×1 IMPLANT
BLADE SURG 15 STRL SS (BLADE) ×1
BUR CROSS CUT FISSURE 1.6 (BURR) ×1 IMPLANT
BUR EGG ELITE 4.0 (BURR) ×1 IMPLANT
CANISTER SUCT 3000ML PPV (MISCELLANEOUS) ×1 IMPLANT
COVER SURGICAL LIGHT HANDLE (MISCELLANEOUS) ×1 IMPLANT
GAUZE PACKING FOLDED 2 STR (GAUZE/BANDAGES/DRESSINGS) ×1 IMPLANT
GLOVE BIO SURGEON STRL SZ 6.5 (GLOVE) IMPLANT
GLOVE BIO SURGEON STRL SZ7 (GLOVE) IMPLANT
GLOVE BIO SURGEON STRL SZ8 (GLOVE) ×1 IMPLANT
GLOVE BIOGEL PI IND STRL 6.5 (GLOVE) IMPLANT
GLOVE BIOGEL PI IND STRL 7.0 (GLOVE) IMPLANT
GOWN STRL REUS W/ TWL LRG LVL3 (GOWN DISPOSABLE) ×1 IMPLANT
GOWN STRL REUS W/ TWL XL LVL3 (GOWN DISPOSABLE) ×1 IMPLANT
GOWN STRL REUS W/TWL LRG LVL3 (GOWN DISPOSABLE) ×1
GOWN STRL REUS W/TWL XL LVL3 (GOWN DISPOSABLE) ×1
IV NS 1000ML (IV SOLUTION) ×1
IV NS 1000ML BAXH (IV SOLUTION) ×1 IMPLANT
KIT BASIN OR (CUSTOM PROCEDURE TRAY) ×1 IMPLANT
KIT TURNOVER KIT B (KITS) ×1 IMPLANT
NDL HYPO 25GX1X1/2 BEV (NEEDLE) ×2 IMPLANT
NEEDLE HYPO 25GX1X1/2 BEV (NEEDLE) ×1 IMPLANT
NS IRRIG 1000ML POUR BTL (IV SOLUTION) ×1 IMPLANT
PAD ARMBOARD 7.5X6 YLW CONV (MISCELLANEOUS) ×1 IMPLANT
SLEEVE IRRIGATION ELITE 7 (MISCELLANEOUS) ×1 IMPLANT
SPIKE FLUID TRANSFER (MISCELLANEOUS) ×2 IMPLANT
SPONGE SURGIFOAM ABS GEL 12-7 (HEMOSTASIS) IMPLANT
SUT CHROMIC 3 0 PS 2 (SUTURE) ×1 IMPLANT
SYR BULB IRRIG 60ML STRL (SYRINGE) ×2 IMPLANT
SYR CONTROL 10ML LL (SYRINGE) ×1 IMPLANT
TRAY ENT MC OR (CUSTOM PROCEDURE TRAY) ×1 IMPLANT
TUBING IRRIGATION (MISCELLANEOUS) ×1 IMPLANT
YANKAUER SUCT BULB TIP NO VENT (SUCTIONS) ×1 IMPLANT

## 2022-12-11 NOTE — Op Note (Signed)
NAMESOCORRO, Diana [name]: [id number] ACCOUNT NO: [id number] DATE OF BIRTH: February 17, 1966 FACILITY: [facility] LOCATION: [facility] PHYSICIAN: Georgia Lopes, DDS  Operative Report   DATE OF PROCEDURE: 12/11/2022  PREOPERATIVE DIAGNOSES:  Nonrestorable teeth secondary to dental caries and periodontal disease, numbers 3, 5, 6, 7, 8, 9, 10, 11, 12, 13, 14, 22, 23, 24, 25, 26, 27, 28 and right mandibular lingual torus.  POSTOPERATIVE DIAGNOSES:  Nonrestorable teeth secondary to dental caries and periodontal disease, numbers 3, 5, 6, 7, 8, 9, 10, 11, 12, 13, 14, 22, 23, 24, 25, 26, 27, 28 and right mandibular lingual torus.  PROCEDURE:  Extraction teeth numbers 3, 5, 6, 7, 8, 9, 10, 11, 12, 13, 14, 22, 23, 24, 25, 26, 27, 28; alveoloplasty right and left maxilla and mandible; removal right mandibular lingual torus.  SURGEON:  Georgia Lopes, DDS  ANESTHESIA:  General, nasal intubation.  Dr. Mal Amabile attending.  DESCRIPTION OF PROCEDURE:  The patient was taken to the operating room and placed on the table in supine position.  General anesthesia was administered and a nasal endotracheal tube was placed and secured.  The eyes were protected.  The patient was  draped for surgery.  Timeout was performed.  The posterior pharynx was suctioned and a throat pack was placed.  2% lidocaine, 1:100,000 epinephrine was infiltrated in an inferior alveolar block on the right and left sides and in buccal anterior  infiltration in the mandible. In the maxilla, anesthesia was given by infiltration around the maxillary teeth on the buccal and palatal aspect.  A bite block was placed on the right side of the mouth.  A sweetheart retractor was used to retract the  tongue.  A 15 blade was used to make an incision 1 cm proximal to tooth #22 on the alveolar crest and carried forward in the buccal gingival sulcus and then the lingual gingival sulcus until tooth #27 was encountered.  The periosteum was reflected.  The   teeth were elevated and removed with the rongeurs and Ash forceps.  The sockets were curetted.  Tissue was trimmed and alveoloplasty was performed using the egg bur followed by the bone file.  Then, the area was irrigated and closed with 3-0 chromic.   Then, the left maxilla was operated.  The 15 blade was used to make an incision around teeth numbers 14, 13, 12, 11, 10, 9, 8, 7.  The periosteum was reflected.  The teeth were elevated and removed with the dental forceps and rongeurs.  The sockets were  curetted.  The tissue was trimmed and alveoplasty was performed using the egg bur followed by the bone file.  Then, the area was irrigated and closed with 3-0 chromic.  Then, the right mandible was operated.  The 15 blade was used to make an incision in  the second molar area on the alveolar crest, carried forward in the edentulous area to tooth #27 and then it was taken around teeth numbers 27 and 28.  The periosteum was reflected to expose the lingual torus.  Teeth numbers 27 and 28 were elevated with  a 301 elevator and removed from the mouth with the dental forceps.  The sockets were curetted.  Alveoloplasty was performed using the egg bur and the bone file and then torus was removed using the egg bur and the bone file.  The right mandible was then  irrigated and closed with 3-0 chromic.  The 15 blade was used to make an incision around teeth  numbers 3, 5 and 6.  The periosteum was reflected.  The teeth were elevated with a 301 elevator and removed with rongeurs and dental forceps.  Sockets were  curetted.  The tissue was trimmed and reflected to expose the alveolar crest and then alveoloplasty was performed using the egg bur followed by the bone file.  Then, the area was irrigated and closed with 3-0 chromic.  Additional local anesthesia was  administered.  The oral cavity was irrigated and suctioned.  The throat pack was removed.  The patient was left under the care of anesthesia for extubation and  transported to recovery room with plans for discharge home through day surgery.  ESTIMATED BLOOD LOSS:  Minimum.  COMPLICATIONS:  None.  SPECIMENS:  None.  COUNTS:  Correct.   SHW D: 12/11/2022 8:26:53 am T: 12/11/2022 8:53:00 am  JOB: [id number]/ [id number]

## 2022-12-11 NOTE — Progress Notes (Signed)
Dr. Jerrye Noble aware of patient o2 sats in 88%, being a smoker for 20 years. Pt denies any SOB. Resting in recliner.

## 2022-12-11 NOTE — H&P (Signed)
H&P documentation  -History and Physical Reviewed  -Patient has been re-examined  -No change in the plan of care  Diana Nolan  

## 2022-12-11 NOTE — Op Note (Signed)
12/11/2022  8:22 AM  PATIENT:  Diana Nolan  57 y.o. female  PRE-OPERATIVE DIAGNOSIS:  NON-RESTORABLE TEETH NUMBER THREE, FIVE, SIX, SEVEN, EIGHT, NINE, TEN, ELEVEN, TWELVE, THIRTEEN, FOURTEEN, TWENTY-TWO, TWENTY-THREE, TWENTY-FOUR, TWENTY-FIVE, TWENTY-SIX, TWENTY-SEVEN, TWENTY-EIGHT, ALVEOLPLASTY, RIGHT MANDIBULAR LINUALTORUS  POST-OPERATIVE DIAGNOSIS:  SAME  PROCEDURE:  Procedure(s): EXTRACTION TEETH NUMBER THREE, FIVE, SIX, SEVEN, EIGHT, NINE, TEN, ELEVEN, TWELVE, THIRTEEN, FOURTEEN, TWENTY-TWO, TWENTY-THREE, TWENTY-FOUR, TWENTY-FIVE, TWENTY-SIX, TWENTY-SEVEN, TWENTY-EIGHT, ALVEOLPLASTY, REMOVAL RIGHT MANDIBULAR LINGUAL TORUS  SURGEON:  Surgeon(s): Ocie Doyne, DMD  ANESTHESIA:   local and general  EBL:  minimal  DRAINS: none   SPECIMEN:  No Specimen  COUNTS:  YES  PLAN OF CARE: Discharge to home after PACU  PATIENT DISPOSITION:  PACU - hemodynamically stable.   PROCEDURE DETAILS: Dictation #[id number]  Georgia Lopes, DMD 12/11/2022 8:22 AM

## 2022-12-11 NOTE — Transfer of Care (Signed)
Immediate Anesthesia Transfer of Care Note  Patient: Diana Nolan  Procedure(s) Performed: EXTRACTION TEETH NUMBER THREE, FIVE, SIX, SEVEN, EIGHT, NINE, TEN, ELEVEN, TWELVE, THIRTEEN, FOURTEEN, TWENTY-TWO, TWENTY-THREE, TWENTY-FOUR, TWENTY-FIVE, TWENTY-SIX, TWENTY-SEVEN, TWENTY-EIGHT, ALVEOLPLASTY, REMOVAL RIGHT MANDIBULAR LINUALTORUS  Patient Location: PACU  Anesthesia Type:General  Level of Consciousness: awake, alert , patient cooperative, and responds to stimulation  Airway & Oxygen Therapy: Patient Spontanous Breathing and Patient connected to face mask oxygen  Post-op Assessment: Report given to RN, Post -op Vital signs reviewed and stable, and Patient moving all extremities X 4  Post vital signs: Reviewed and stable  Last Vitals:  Vitals Value Taken Time  BP 117/68 12/11/22 0833  Temp    Pulse 95 12/11/22 0836  Resp 17 12/11/22 0836  SpO2 92 % 12/11/22 0836  Vitals shown include unfiled device data.  Last Pain:  Vitals:   12/11/22 0617  TempSrc:   PainSc: 7       Patients Stated Pain Goal: 2 (12/11/22 0617)  Complications: No notable events documented.

## 2022-12-11 NOTE — Anesthesia Postprocedure Evaluation (Signed)
Anesthesia Post Note  Patient: Diana Nolan  Procedure(s) Performed: EXTRACTION TEETH NUMBER THREE, FIVE, SIX, SEVEN, EIGHT, NINE, TEN, ELEVEN, TWELVE, THIRTEEN, FOURTEEN, TWENTY-TWO, TWENTY-THREE, TWENTY-FOUR, TWENTY-FIVE, TWENTY-SIX, TWENTY-SEVEN, TWENTY-EIGHT, ALVEOLPLASTY, REMOVAL RIGHT MANDIBULAR LINUALTORUS     Patient location during evaluation: PACU Anesthesia Type: General Level of consciousness: awake and alert Pain management: pain level controlled Vital Signs Assessment: post-procedure vital signs reviewed and stable Respiratory status: spontaneous breathing, nonlabored ventilation and respiratory function stable Cardiovascular status: stable and blood pressure returned to baseline Anesthetic complications: no   No notable events documented.  Last Vitals:  Vitals:   12/11/22 0845 12/11/22 0900  BP: 116/64 120/71  Pulse: 89 88  Resp: 17 (!) 22  Temp:  37.1 C  SpO2: 90% 90%    Last Pain:  Vitals:   12/11/22 0900  TempSrc:   PainSc: 0-No pain                 Beryle Lathe

## 2022-12-11 NOTE — Anesthesia Procedure Notes (Addendum)
Procedure Name: Intubation Date/Time: 12/11/2022 7:25 AM  Performed by: Shary Decamp, CRNAPre-anesthesia Checklist: Patient identified, Emergency Drugs available, Suction available and Patient being monitored Patient Re-evaluated:Patient Re-evaluated prior to induction Oxygen Delivery Method: Circle system utilized Preoxygenation: Pre-oxygenation with 100% oxygen Induction Type: IV induction Ventilation: Mask ventilation without difficulty Laryngoscope Size: Mac, 3 and Glidescope Grade View: Grade I Nasal Tubes: Nasal prep performed, Nasal Rae and Left Tube size: 7.0 mm Number of attempts: 1 Airway Equipment and Method: Video-laryngoscopy Placement Confirmation: ETT inserted through vocal cords under direct vision, positive ETCO2 and breath sounds checked- equal and bilateral Tube secured with: Tape Dental Injury: Teeth and Oropharynx as per pre-operative assessment  Difficulty Due To: Difficult Airway- due to dentition

## 2022-12-12 ENCOUNTER — Encounter (HOSPITAL_COMMUNITY): Payer: Self-pay | Admitting: Oral Surgery

## 2024-01-03 DEATH — deceased
